# Patient Record
Sex: Female | Born: 1975 | Race: Asian | Hispanic: No | Marital: Married | Smoking: Never smoker
Health system: Southern US, Community
[De-identification: ages and names within clinical notes are randomized; demographics above are authoritative.]

## PROBLEM LIST (undated history)

## (undated) DIAGNOSIS — B1910 Unspecified viral hepatitis B without hepatic coma: Secondary | ICD-10-CM

## (undated) HISTORY — DX: Unspecified viral hepatitis B without hepatic coma: B19.10

## (undated) HISTORY — PX: OTHER SURGICAL HISTORY: SHX169

---

## 2007-01-05 ENCOUNTER — Inpatient Hospital Stay (HOSPITAL_COMMUNITY): Admission: AD | Admit: 2007-01-05 | Discharge: 2007-01-08 | Payer: Self-pay | Admitting: Obstetrics and Gynecology

## 2010-07-10 ENCOUNTER — Inpatient Hospital Stay (HOSPITAL_COMMUNITY)
Admission: AD | Admit: 2010-07-10 | Discharge: 2010-07-12 | DRG: 775 | Disposition: A | Discharge status: Home / Self Care | Payer: Medicaid Other | Source: Ambulatory Visit | Attending: Obstetrics and Gynecology | Admitting: Obstetrics and Gynecology

## 2010-07-10 DIAGNOSIS — O3660X0 Maternal care for excessive fetal growth, unspecified trimester, not applicable or unspecified: Secondary | ICD-10-CM | POA: Diagnosis present

## 2010-07-10 LAB — CBC
Platelets: 190 10*3/uL (ref 150–400)
RBC: 4.19 MIL/uL (ref 3.87–5.11)
WBC: 12.9 10*3/uL — ABNORMAL HIGH (ref 4.0–10.5)

## 2010-07-10 LAB — RPR: RPR Ser Ql: NONREACTIVE

## 2010-07-16 ENCOUNTER — Inpatient Hospital Stay (HOSPITAL_COMMUNITY): Admission: AD | Admit: 2010-07-16 | Payer: Self-pay | Admitting: Obstetrics and Gynecology

## 2010-07-30 NOTE — Discharge Summary (Signed)
  April Gibson, April Gibson                   ACCOUNT NO.:  [id number]  MEDICAL RECORD NO.:  [id number]           PATIENT TYPE:  I  LOCATION:  9133                          FACILITY:  [facility]  PHYSICIAN:  Zenaida Niece, M.D.DATE OF BIRTH:  13-Nov-1975  DATE OF ADMISSION:  07/10/2010 DATE OF DISCHARGE:  07/12/2010                              DISCHARGE SUMMARY   ADMISSION DIAGNOSES:  Intrauterine pregnancy at 39 weeks, hepatitis B carrier.  DISCHARGE DIAGNOSES:  Intrauterine pregnancy at 39 weeks, hepatitis B carrier.  PROCEDURE:  On July 09, 2010, she had a vacuum-assisted vaginal delivery.  HISTORY AND PHYSICAL:  This is a 35 year old gravida 2, para 1-0-0-1 with an EGA of [redacted] weeks who was admitted with regular contractions. Cervix in maternal admissions was 6 cm dilated with regular contractions.  Prenatal care complicated by size greater than dates. Estimated fetal weight 75th percentile and an AFI of 20 on February 21st and prominent fetal renal pelves.  Significant prenatal labs blood type is A+ with negative antibody screen, Hepatitis B surface antigen is positive with normal LFTs.  First trimester screen normal.  AFP is normal Glucola 134, group B strep negative.  PAST OB HISTORY:  One vaginal delivery at 38 weeks, 7 pounds 1 ounce, vacuum extraction.  PAST MEDICAL HISTORY:  Hepatitis B carrier.  PHYSICAL EXAMINATION:  VITAL SIGNS:  She is afebrile with stable vital signs.  Fetal heart tracing category one with regular contractions. ABDOMEN:  Gravid, nontender, with an estimated fetal weight of 7 pounds. Cervix on my first exam, she is 9 complete +1 vertex presentation and membranes were ruptured revealing clear fluid.  HOSPITAL COURSE:  The patient was admitted and progressed on her own. She progressed to complete and pushed fairly well.  She had a vacuum- assisted vaginal delivery due to prolonged variable decelerations with slow recovery.  This delivered a viable female  infant with Apgars of 9 and 9, weight 7 pounds 6 ounces.  Placenta delivered spontaneous was intact, perineum was intact.  Estimated blood loss was less than 500 mL. Postpartum, she had no significant complications.  On postpartum #2, she was stable for discharge home.  DISCHARGE INSTRUCTIONS:  Regular diet, pelvic rest follow-up in 6 weeks. Medications are over-the-counter ibuprofen as needed, and she is given our discharge pamphlet.    Zenaida Niece, M.D.    TDM/MEDQ  D:  07/12/2010  T:  07/12/2010  Job:  [id number]  Electronically Signed by Lavina Hamman M.D. on 07/30/2010 08:53:08 AM

## 2010-09-21 NOTE — Discharge Summary (Signed)
[name], April Gibson                   ACCOUNT NO.:  [id number]   MEDICAL RECORD NO.:  [id number]          PATIENT TYPE:  INP   LOCATION:  9126                          [facility]ACILITY:  [facility]   PHYSICIAN:  Zenaida Niece, M.D.DATE OF BIRTH:  Jan 01, 1976   DATE OF ADMISSION:  01/05/2007  DATE OF DISCHARGE:  01/08/2007                               DISCHARGE SUMMARY   ADMISSION DIAGNOSIS:  1. Intrauterine pregnancy at 38 weeks.  2. Hepatitis B carrier.   DISCHARGE DIAGNOSES:  Same.   PROCEDURES:  On 08/30 she had a vacuum-assisted vaginal delivery.   HISTORY AND PHYSICAL:  This is a 35 year old Asian female, gravida 1,  para 0, with an EGA of 38+ weeks who presented to the office on 08/29  with a complaint of contractions every 7 to 8 minutes and possible  leaking fluid.  Exam in the office revealed cervical change to 4, 70%,  with a bulging bag, Nitrazine positive and FERN negative.  Prenatal care  complicated only by positive hepatitis B carrier with normal LFT's.   PRENATAL LABORATORY STUDIES:  Blood type is A+ with negative antibody  screen, RPR nonreactive, rubella immune, hepatitis B surface antigen is  positive with normal LFT's.  HIV negative, gonorrhea and chlamydia  negative, group B strep is negative, and 1-hour Glucola was 119.   PHYSICAL EXAMINATION:  VITAL SIGNS:  She is afebrile with stable vital  signs.  Fetal heart tracing 140's and reassuring.  ABDOMEN:  Gravid,  nontender.  PELVIC:  Cervix 4/75, bulging bag, and amniotomy reveals clear fluid.   HOSPITAL COURSE:  The patient was admitted, and Dr. Senaida Ores performed  amniotomy.  She then required Pitocin augmentation.  She reached  complete and pushed for approximately three hours.  She brought the  vertex to +2 to +3 and then was exhausted.  Dr. Senaida Ores then  performed a vacuum-assisted vaginal delivery of a viable female infant  with Apgars of 9 and 9, weight 7 pounds 1 ounce.  Placenta delivered  spontaneous.  She  had a small left labial laceration repaired with 2-0  Vicryl.  Cervix, rectum and vagina were otherwise intact and estimated  blood loss 400 mL.  She did have a temperature initially after delivery  to 101.2, and was put on Unasyn.  She remained afebrile after that.  Pre-  delivery hemoglobin was 12.8, post-delivery is 10.8.  On postpartum day  number two, which is September 1, she has been afebrile for at least 36  hours.  She is felt to be stable enough for discharge home.   DISCHARGE INSTRUCTIONS:  Regular diet, pelvic rest, followup in 6 weeks.   DISCHARGE MEDICATIONS:  Percocet #20 one to two p.o. q.4 to 6 hours  p.r.n. pain.   She is given our discharge pamphlet.      Zenaida Niece, M.D.  Electronically Signed     TDM/MEDQ  D:  01/08/2007  T:  01/08/2007  Job:  [id number]

## 2011-02-18 LAB — CBC
Hemoglobin: 10.8 — ABNORMAL LOW
Hemoglobin: 12.8
MCV: 82.3
RBC: 3.97
RBC: 4.8
RDW: 15.7 — ABNORMAL HIGH
WBC: 13.3 — ABNORMAL HIGH
WBC: 24.2 — ABNORMAL HIGH

## 2012-10-25 ENCOUNTER — Ambulatory Visit (INDEPENDENT_AMBULATORY_CARE_PROVIDER_SITE_OTHER): Payer: BC Managed Care – PPO | Admitting: Family Medicine

## 2012-10-25 VITALS — BP 111/78 | HR 94 | Temp 98.0°F | Resp 19 | Ht 59.0 in | Wt 109.0 lb

## 2012-10-25 DIAGNOSIS — R42 Dizziness and giddiness: Secondary | ICD-10-CM

## 2012-10-25 DIAGNOSIS — N390 Urinary tract infection, site not specified: Secondary | ICD-10-CM

## 2012-10-25 DIAGNOSIS — R110 Nausea: Secondary | ICD-10-CM

## 2012-10-25 LAB — POCT CBC
Granulocyte percent: 71.9 % (ref 37–80)
HCT, POC: 43.7 % (ref 37.7–47.9)
Hemoglobin: 13.6 g/dL (ref 12.2–16.2)
Lymph, poc: 2.6 (ref 0.6–3.4)
MCH, POC: 30.2 pg (ref 27–31.2)
MCHC: 31.1 g/dL — AB (ref 31.8–35.4)
MCV: 97 fL (ref 80–97)
MID (cbc): 0.7 (ref 0–0.9)
MPV: 8.9 fL (ref 0–99.8)
POC Granulocyte: 8.6 — AB (ref 2–6.9)
POC LYMPH PERCENT: 22.2 % (ref 10–50)
POC MID %: 5.9 %M (ref 0–12)
Platelet Count, POC: 288 10*3/uL (ref 142–424)
RBC: 4.5 M/uL (ref 4.04–5.48)
RDW, POC: 12.3 %
WBC: 11.9 10*3/uL — AB (ref 4.6–10.2)

## 2012-10-25 LAB — COMPREHENSIVE METABOLIC PANEL
AST: 20 U/L (ref 0–37)
Albumin: 5.1 g/dL (ref 3.5–5.2)
Alkaline Phosphatase: 76 U/L (ref 39–117)
Potassium: 3.8 mEq/L (ref 3.5–5.3)
Sodium: 139 mEq/L (ref 135–145)
Total Protein: 8.7 g/dL — ABNORMAL HIGH (ref 6.0–8.3)

## 2012-10-25 LAB — POCT UA - MICROSCOPIC ONLY
Bacteria, U Microscopic: NEGATIVE
Casts, Ur, LPF, POC: NEGATIVE
Crystals, Ur, HPF, POC: NEGATIVE
Mucus, UA: NEGATIVE
RBC, urine, microscopic: NEGATIVE
Yeast, UA: NEGATIVE

## 2012-10-25 LAB — POCT URINALYSIS DIPSTICK
Bilirubin, UA: NEGATIVE
Blood, UA: NEGATIVE
Glucose, UA: NEGATIVE
Ketones, UA: NEGATIVE
Nitrite, UA: NEGATIVE
Protein, UA: NEGATIVE
Spec Grav, UA: 1.01
Urobilinogen, UA: 0.2
pH, UA: 7

## 2012-10-25 LAB — COMPREHENSIVE METABOLIC PANEL WITH GFR
ALT: 15 U/L (ref 0–35)
BUN: 7 mg/dL (ref 6–23)
CO2: 31 meq/L (ref 19–32)
Calcium: 9.9 mg/dL (ref 8.4–10.5)
Chloride: 101 meq/L (ref 96–112)
Creat: 0.6 mg/dL (ref 0.50–1.10)
Glucose, Bld: 95 mg/dL (ref 70–99)
Total Bilirubin: 0.4 mg/dL (ref 0.3–1.2)

## 2012-10-25 LAB — POCT URINE PREGNANCY: Preg Test, Ur: NEGATIVE

## 2012-10-25 MED ORDER — CIPROFLOXACIN HCL 250 MG PO TABS: 250.0000 mg | ORAL_TABLET | Freq: Two times a day (BID) | ORAL | Status: DC

## 2012-10-25 NOTE — Patient Instructions (Signed)
April Gibson ???ng Ti?t Ni?u  (Urinary Tract Infection)  April Gibson ???ng ti?t ni?u (UTI) c th? pht tri?n ?? b?t c? vi? tri? na?o d?c ???ng ti?t ni?u. ???ng ti?t ni?u l h? th?ng thot n??c c?a c? th? ?? lo?i b? ch?t th?i v n??c d? th?a. ???ng ti?t ni?u bao g?m hai th?n, ni?u qu?n, bng quang v ni?u ??o. Th?n l m?t c?p c? quan hnh h?t ??u. M?i th?n co? kch th??c kho?ng b?ng bn tay c?a b?n. Chng n?m d??i x??ng s??n, m?i bn c?t s?ng c m?t qu?[facility] [facility]  April Gibson gy b?i vi sinh v?t, l cc sinh v?t nh?, bao g?m n?m, vi rt v vi khu?n. Nh?ng sinh v?t ny nh? t?i m?c chng ch? c th? ???c nhn th?y qua knh hi?n vi. Vi khu?n l nh?ng vi sinh v?t ph? bi?n nh?t gy ra UTI.  TRI?U CH?NG  Cc tri?u ch?ng c?a UTI c th? khc nhau, ty theo ?? tu?i v gi?i tnh c?a b?nh nhn v b?i v? tr April Gibson. Cc tri?u ch?ng ? ph? n? tr? th??ng bao g?m nhu c?u ti?u ti?n th??ng xuyn v d? d?i, c?m gic ?au v rt ? bng quang ho?c ni?u ??o khi ?i ti?u. Ph? n? v nam gi?i l?n tu?i c nhi?u kh? n?ng b? m?t m?i, run r?y v y?u, ??ng th?i b? ?au c? v ?au b?ng. S?t c th? c ngh?a l April Gibson ? th?n. Cc tri?u ch?ng khc c?a April Gibson th?n bao g?m ?au ? l?ng ho?c hai bn d??i x??ng s??n, bu?n nn v nn m?a.  CH?N ?ON  ?? ch?n ?on UTI, chuyn gia ch?m [place] y t? s? h?i b?n v? nh?ng tri?u ch?ng c?a b?n. Chuyn gia ch?m [place] y t? c?ng s? yu c?u cung c?p m?u n??c ti?u. M?u n??c ti?u s? ???c xt nghi?m xem c vi khu?n v cc t? bo mu tr?ng khng. Cc t? bo mu tr?ng ???c t?o b?i c? th? ?? gip ch?ng l?i April Gibson.  ?I?U TR?  Thng th??ng, UTI c th? ???c ?i?u tr? b?ng thu?c. B?i v h?u h?t nguyn nhn gy ra UTI l do April Gibson b?i vi khu?n, chng th??ng c th? ???c ?i?u tr? b?ng cch s? d?ng thu?c khng sinh. Vi?c l?a ch?n khng sinh v th?i gian ?i?u tr? ph? thu?c vo tri?u ch?ng v lo?i vi khu?n gy April Gibson.  H??NG D?N CH?M [place] T?I NH   N?u b?n ? ???c k khng sinh, hy s? d?ng chng  ?ng nh? chuyn gia ch?m [place] y t? h??ng d?n. S? d?ng h?t thu?c ngay c? khi b?n c?m th?y kh h?n sau khi ch? dng m?t ph?n thu?c.  U?ng ?? n??c v dung d?ch ?? n??c ti?u trong ho?c c mu vng nh?t.  Trnh caffeine, tr v ?? u?ng c ga. Chng c xu h??ng kch thch bng quang.  ?i ti?u th??ng xuyn. Trnh nh?n ti?u trong th?i gian di.  ?i ti?u tr??c v sau khi quan h? tnh d?c.  Sau khi ?i ??i ti?n, ph? n? c?n lm s?ch t? tr??c ra sau. Ch? s? d?ng gi?y v? sinh m?t l?n. HY THAM V?N V?I CHUYN GIA Y T? N?U:   B?n b? ?au l?ng.  B?n b? s?t.  Cc tri?u ch?ng c?a b?n khng b?t ??u ?? trong vng 3 ngy. HY NGAY L?P T?C THAM V?N V?I CHUYN GIA Y T? N?U:   B?n b? ?au l?ng ho?c ?au b?ng d??i nghim tr?ng.  B?n pht tri?n ?  n l?nh.  B?n b? bu?n nn ho?c nn m?a.  B?n b? rt ho?c kh ch?u ti?p t?c khi ?i ti?u. ??M B?O B?N:   Hi?u cc h??ng d?n ny.  S? theo di tnh tr?ng c?a mnh.  S? yu c?u tr? gip ngay l?p t?c n?u b?n c?m th?y khng kh?e ho?c tnh tr?ng tr? nn t?i h?n. Document Released: 04/25/2005 Document Revised: 10/25/2011 [facility] Patient Information 2014 [place], [place].

## 2012-10-25 NOTE — Progress Notes (Signed)
[facility]:  Office Visit  Chief Complaint:  Chief Complaint  Patient presents with  . Dizziness    dizzy when walking and sitting started tuesday     HPI: April Gibson is a 37 y.o. female who complains of  Dizziness 1-2 seconds then resolves and difficulty walking since Tuesday. No HA or vision changes, no h/o vertigo. No gait changesShe feels fatigued without energy. No dizziness with moving head. Dizziness with sitting to standing   position. + minimal HA on top of head. NO facial pain or ear pain. No allergies. Denies fevers or chills. Denies urinary sxs. Drinks a lot of water. 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 + nausea. LMP October 08, 2012  History reviewed. No pertinent past medical history. History reviewed. No pertinent past surgical history. History   Social History  . Marital Status: Married    Spouse Name: N/A    Number of Children: N/A  . Years of Education: N/A   Social History Main Topics  . Smoking status: Never Smoker   . Smokeless tobacco: None  . Alcohol Use: No  . Drug Use: No  . Sexually Active: None   Other Topics Concern  . None   Social History Narrative  . None   History reviewed. No pertinent family history. No Known Allergies Prior to Admission medications   Not on File     ROS: The patient denies fevers, chills, night sweats, unintentional weight loss, chest pain, palpitations, wheezing, dyspnea on exertion, nausea, vomiting, abdominal pain, dysuria, hematuria, melena, numbness, or tingling.  All other systems have been reviewed and were otherwise negative with the exception of those mentioned in the HPI and as above.    PHYSICAL EXAM: Filed Vitals:   10/25/12 1654  BP: 111/78  Pulse: 94  Temp:   Resp:    Filed Vitals:   10/25/12 1504  Height: 4\' 11"  (1.499 m)  Weight: 109 lb (49.442 kg)   Body mass index is 22 kg/(m^2).  General: Alert, no acute distress HEENT:  Normocephalic,  atraumatic, oropharynx patent. EOMI, PERRLA. Fundoscopic exam nl Cardiovascular:  Regular rate and rhythm, no rubs murmurs or gallops.  No Carotid bruits, radial pulse intact. No pedal edema.  Respiratory: Clear to auscultation bilaterally.  No wheezes, rales, or rhonchi.  No cyanosis, no use of accessory musculature GI: No organomegaly, abdomen is soft and non-tender, positive bowel sounds.  No masses. Skin: No rashes. Neurologic: Facial musculature symmetric. CN 2-12 grossly normal Psychiatric: Patient is appropriate throughout our interaction. Lymphatic: No cervical lymphadenopathy Musculoskeletal: Gait intact.   LABS: Results for orders placed in visit on 10/25/12  POCT CBC      Result Value Range   WBC 11.9 (*) 4.6 - 10.2 K/uL   Lymph, poc 2.6  0.6 - 3.4   POC LYMPH PERCENT 22.2  10 - 50 %L   MID (cbc) 0.7  0 - 0.9   POC MID % 5.9  0 - 12 %M   POC Granulocyte 8.6 (*) 2 - 6.9   Granulocyte percent 71.9  37 - 80 %G   RBC 4.50  4.04 - 5.48 M/uL   Hemoglobin 13.6  12.2 - 16.2 g/dL   HCT, POC 78.4  69.6 - 47.9 %   MCV 97.0  80 - 97 fL   MCH, POC 30.2  27 - 31.2 pg   MCHC 31.1 (*) 31.8 - 35.4 g/dL   RDW, POC 12.3  Platelet Count, POC 288  142 - 424 K/uL   MPV 8.9  0 - 99.8 fL  POCT URINALYSIS DIPSTICK      Result Value Range   Color, UA light yellow     Clarity, UA clear     Glucose, UA neg     Bilirubin, UA neg     Ketones, UA neg     Spec Grav, UA 1.010     Blood, UA neg     pH, UA 7.0     Protein, UA neg     Urobilinogen, UA 0.2     Nitrite, UA neg     Leukocytes, UA Trace    POCT UA - MICROSCOPIC ONLY      Result Value Range   WBC, Ur, HPF, POC 0-3     RBC, urine, microscopic neg     Bacteria, U Microscopic neg     Mucus, UA neg     Epithelial cells, urine per micros 0-1     Crystals, Ur, HPF, POC neg     Casts, Ur, LPF, POC neg     Yeast, UA neg    POCT URINE PREGNANCY      Result Value Range   Preg Test, Ur Negative       EKG/XRAY:   Primary read  interpreted by Dr. Conley Rolls at [facility].   ASSESSMENT/PLAN: Encounter Diagnoses  Name Primary?  . Dizziness, nonspecific Yes  . Nausea alone   . UTI (urinary tract infection)    Urine cx pending Rx Cipro 250 mg BID x 3 days Push fluids Orthostatics are nl. But dizziness is fleeting and usually with postural changes so it may just be that in addition to UTI condiering her mildly leukocytosis. Advise to get up and down slowly Gross sideeffects, risk and benefits, and alternatives of medications d/w patient. Patient is aware that all medications have potential sideeffects and we are unable to predict every sideeffect or drug-drug interaction that may occur. F/u prn    Janoah Menna PHUONG, DO 10/25/2012 5:56 PM

## 2012-10-26 LAB — URINE CULTURE
Colony Count: NO GROWTH
Organism ID, Bacteria: NO GROWTH

## 2012-10-28 ENCOUNTER — Encounter: Payer: Self-pay | Admitting: Family Medicine

## 2012-11-18 ENCOUNTER — Ambulatory Visit (INDEPENDENT_AMBULATORY_CARE_PROVIDER_SITE_OTHER): Payer: BC Managed Care – PPO | Admitting: Family Medicine

## 2012-11-18 ENCOUNTER — Other Ambulatory Visit: Payer: Self-pay | Admitting: Family Medicine

## 2012-11-18 VITALS — BP 108/69 | HR 99 | Temp 98.5°F | Resp 16 | Ht 59.5 in | Wt 112.0 lb

## 2012-11-18 DIAGNOSIS — M62838 Other muscle spasm: Secondary | ICD-10-CM

## 2012-11-18 DIAGNOSIS — K219 Gastro-esophageal reflux disease without esophagitis: Secondary | ICD-10-CM

## 2012-11-18 DIAGNOSIS — R768 Other specified abnormal immunological findings in serum: Secondary | ICD-10-CM

## 2012-11-18 DIAGNOSIS — R894 Abnormal immunological findings in specimens from other organs, systems and tissues: Secondary | ICD-10-CM

## 2012-11-18 DIAGNOSIS — R42 Dizziness and giddiness: Secondary | ICD-10-CM

## 2012-11-18 LAB — POCT CBC
MCH, POC: 30 pg (ref 27–31.2)
MCHC: 30.9 g/dL — AB (ref 31.8–35.4)
MID (cbc): 0.4 (ref 0–0.9)
MPV: 9 fL (ref 0–99.8)
POC LYMPH PERCENT: 36.1 %L (ref 10–50)
POC MID %: 6.1 %M (ref 0–12)
Platelet Count, POC: 306 10*3/uL (ref 142–424)
RBC: 4.56 M/uL (ref 4.04–5.48)
RDW, POC: 13.2 %
WBC: 6.7 10*3/uL (ref 4.6–10.2)

## 2012-11-18 LAB — POCT URINALYSIS DIPSTICK
Bilirubin, UA: NEGATIVE
Ketones, UA: NEGATIVE
Protein, UA: NEGATIVE
Spec Grav, UA: 1.015
pH, UA: 7.5

## 2012-11-18 LAB — COMPREHENSIVE METABOLIC PANEL
ALT: 15 U/L (ref 0–35)
AST: 18 U/L (ref 0–37)
Alkaline Phosphatase: 71 U/L (ref 39–117)
BUN: 12 mg/dL (ref 6–23)
Chloride: 105 mEq/L (ref 96–112)
Creat: 0.5 mg/dL (ref 0.50–1.10)
Total Bilirubin: 0.6 mg/dL (ref 0.3–1.2)

## 2012-11-18 LAB — POCT UA - MICROSCOPIC ONLY: Crystals, Ur, HPF, POC: NEGATIVE

## 2012-11-18 LAB — HEPATITIS B CORE ANTIBODY, TOTAL: Hep B Core Total Ab: POSITIVE — AB

## 2012-11-18 LAB — HEPATITIS B CORE ANTIBODY, IGM: Hep B C IgM: NEGATIVE

## 2012-11-18 MED ORDER — OMEPRAZOLE 40 MG PO CPDR: 40.0000 mg | DELAYED_RELEASE_CAPSULE | Freq: Every day | ORAL | Status: DC

## 2012-11-18 MED ORDER — CYCLOBENZAPRINE HCL 10 MG PO TABS: 10.0000 mg | ORAL_TABLET | Freq: Three times a day (TID) | ORAL | Status: DC | PRN

## 2012-11-18 MED ORDER — MELOXICAM 7.5 MG PO TABS: 7.5000 mg | ORAL_TABLET | Freq: Two times a day (BID) | ORAL | Status: DC | PRN

## 2012-11-18 NOTE — Patient Instructions (Addendum)
Take the stomach medicine omeprazole every day 30 minutes before breakfast for 6 weeks.  Take the anti-inflammatory meloxicam every morning with breakfast for 6 weeks, you can take a second dose during the day if you have headache or neck strain.  If you feel like the muscles in your back or neck are tight, try the flexeril.  If your symptoms continue, make sure you come back for further evaluation.

## 2012-11-18 NOTE — Progress Notes (Signed)
Subjective:    Patient ID: April Gibson, female    DOB: 15-Dec-1975, 37 y.o.   MRN: [id number] Chief Complaint  Patient presents with  . Headache    x 2 days   . Dizziness    x 2 days  . Abdominal Pain    epigastric  . Hand Pain    tightness in left hand     HPI   3 wks ago she was haivng a HA and fell like she wanted to fall down - was seen by Dr. Conley Rolls and diagnosed with UTI and put on cipro but only took for 2d as then she began having back pain.  She was starting to feel better but now for the past 3d she is having problems again w/ HA and lightheadedness, nausea, epigastric pain, fatigue, left arm feels week - not numb - for the past 3d. Occ heartburn or indigestion.  Has epigastric pain which makes it hard to breath.  Is right-handed.  LMP 11/10/12.  HA is in occiput, advil helps a lot, neck tight and painful.  When she works, she feels like she is going to fall forward.   In 2006 was diagnosed with Hep B during her first pregnancy - thinks she still has it but unsure. In 2012, was found to have some sugar in her urine.  History reviewed. No pertinent past medical history. No current outpatient prescriptions on file prior to visit.   No current facility-administered medications on file prior to visit.   No Known Allergies   Review of Systems  Constitutional: Positive for fatigue. Negative for fever, chills, diaphoresis, appetite change and unexpected weight change.  HENT: Positive for neck pain and neck stiffness. Negative for ear pain, congestion, rhinorrhea, dental problem, sinus pressure and tinnitus.   Eyes: Negative for pain, discharge and visual disturbance.  Respiratory: Positive for shortness of breath. Negative for cough.   Cardiovascular: Negative for chest pain, palpitations and leg swelling.  Gastrointestinal: Positive for nausea and abdominal pain. Negative for vomiting.  Musculoskeletal: Positive for myalgias and back pain. Negative for joint swelling, arthralgias and  gait problem.  Skin: Negative for rash.  Neurological: Positive for dizziness, weakness, light-headedness and headaches. Negative for tremors, seizures, syncope, facial asymmetry, speech difficulty and numbness.  Psychiatric/Behavioral: Negative for sleep disturbance.      BP 108/69  Pulse 99  Temp(Src) 98.5 F (36.9 C) (Oral)  Resp 16  Ht 4' 11.5" (1.511 m)  Wt 112 lb (50.803 kg)  BMI 22.25 kg/m2  SpO2 100%  LMP 11/10/2012 Objective:   Physical Exam  Constitutional: She is oriented to person, place, and time. She appears well-developed and well-nourished. No distress.  HENT:  Head: Normocephalic and atraumatic.  Right Ear: External ear normal.  Left Ear: External ear normal.  Eyes: Conjunctivae are normal. No scleral icterus.  Neck: Normal range of motion. Neck supple. No thyromegaly present.  Cardiovascular: Normal rate, regular rhythm, normal heart sounds and intact distal pulses.   Pulmonary/Chest: Effort normal and breath sounds normal. No respiratory distress.  Musculoskeletal: She exhibits no edema.       Right shoulder: Normal. She exhibits normal range of motion.       Left shoulder: Normal. She exhibits normal range of motion.       Cervical back: Normal. She exhibits normal range of motion.  Lymphadenopathy:    She has no cervical adenopathy.  Neurological: She is alert and oriented to person, place, and time.  Reflex Scores:  Bicep reflexes are 2+ on the right side and 2+ on the left side.      Brachioradialis reflexes are 2+ on the right side and 2+ on the left side.      Patellar reflexes are 2+ on the right side and 2+ on the left side. Skin: Skin is warm and dry. She is not diaphoretic. No erythema.  Psychiatric: She has a normal mood and affect. Her behavior is normal.      Results for orders placed in visit on 11/18/12  POCT UA - MICROSCOPIC ONLY      Result Value Range   WBC, Ur, HPF, POC 4-6     RBC, urine, microscopic 1-2     Bacteria, U  Microscopic 1+     Mucus, UA positive     Epithelial cells, urine per micros 2-5     Crystals, Ur, HPF, POC negative     Casts, Ur, LPF, POC negative     Yeast, UA negative    POCT URINALYSIS DIPSTICK      Result Value Range   Color, UA pale yellow     Clarity, UA clear     Glucose, UA neg     Bilirubin, UA neg     Ketones, UA neg     Spec Grav, UA 1.015     Blood, UA neg     pH, UA 7.5     Protein, UA neg     Urobilinogen, UA 0.2     Nitrite, UA neg     Leukocytes, UA Trace    POCT CBC      Result Value Range   WBC 6.7  4.6 - 10.2 K/uL   Lymph, poc 2.4  0.6 - 3.4   POC LYMPH PERCENT 36.1  10 - 50 %L   MID (cbc) 0.4  0 - 0.9   POC MID % 6.1  0 - 12 %M   POC Granulocyte 3.9  2 - 6.9   Granulocyte percent 57.8  37 - 80 %G   RBC 4.56  4.04 - 5.48 M/uL   Hemoglobin 13.7  12.2 - 16.2 g/dL   HCT, POC 45.4  09.8 - 47.9 %   MCV 97.1 (*) 80 - 97 fL   MCH, POC 30.0  27 - 31.2 pg   MCHC 30.9 (*) 31.8 - 35.4 g/dL   RDW, POC 11.9     Platelet Count, POC 306  142 - 424 K/uL   MPV 9.0  0 - 99.8 fL   EKG: NSR, no ischemic changes Assessment & Plan:  Dizziness and giddiness - Plan: POCT glucose (manual entry), POCT SEDIMENTATION RATE, POCT UA - Microscopic Only, POCT urinalysis dipstick, POCT CBC, Comprehensive metabolic panel, TSH, EKG 12-Lead - suspect due to trapezius muscle spasm causing headache, neck tightness, and left arm numbness. Try meds but if sxs cont, RTC.  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 If cont, RTC and cons xray and PT.  GERD (gastroesophageal reflux disease) - trial of ppi  Meds ordered this encounter  Medications  . omeprazole (PRILOSEC) 40 MG capsule    Sig: Take 1 capsule (40 mg total) by mouth daily. 30 minutes before a meal.     Dispense:  30 capsule    Refill:  1  . cyclobenzaprine (FLEXERIL) 10 MG tablet    Sig: Take 1 tablet (10 mg total) by mouth 3 (three) times daily as needed for muscle spasms.    Dispense:  30 tablet    Refill:  0  . meloxicam (MOBIC) 7.5 MG tablet    Sig: Take 1 tablet (7.5 mg total) by mouth 2 (two) times daily as needed for pain (or headache).    Dispense:  60 tablet    Refill:  1

## 2012-11-19 ENCOUNTER — Other Ambulatory Visit: Payer: Self-pay | Admitting: Family Medicine

## 2012-11-19 ENCOUNTER — Telehealth: Payer: Self-pay | Admitting: Family Medicine

## 2012-11-19 LAB — HEPATITIS B SURF AG CONFIRMATION: Hepatitis B Surf Ag Confirmation: POSITIVE — AB

## 2012-11-19 LAB — HEPATITIS B SURFACE ANTIGEN

## 2012-11-19 NOTE — Telephone Encounter (Signed)
April Gibson from [facility] called back, she states that the [facility] is a out house lab. The requirement is Critical Frozen from a red top. This lab is not done at [facility].

## 2012-11-19 NOTE — Telephone Encounter (Signed)
Ok, fine to cancel

## 2012-11-20 LAB — HEPATITIS B SURFACE ANTIBODY,QUALITATIVE: Hep B S Ab: NEGATIVE

## 2012-11-21 LAB — HEPATITIS B DNA, ULTRAQUANTITATIVE, PCR: Hepatitis B DNA: NOT DETECTED IU/mL (ref <20)

## 2012-11-22 DIAGNOSIS — B1910 Unspecified viral hepatitis B without hepatic coma: Secondary | ICD-10-CM

## 2012-11-22 HISTORY — DX: Unspecified viral hepatitis B without hepatic coma: B19.10

## 2013-01-11 ENCOUNTER — Other Ambulatory Visit: Payer: Self-pay | Admitting: Family Medicine

## 2013-01-24 ENCOUNTER — Encounter: Payer: Self-pay | Admitting: Family Medicine

## 2013-05-10 ENCOUNTER — Encounter: Payer: Self-pay | Admitting: Family Medicine

## 2014-06-18 ENCOUNTER — Ambulatory Visit (INDEPENDENT_AMBULATORY_CARE_PROVIDER_SITE_OTHER): Payer: 59 | Admitting: Family Medicine

## 2014-06-18 VITALS — BP 104/72 | HR 87 | Temp 99.0°F | Resp 20 | Ht 59.0 in | Wt 117.0 lb

## 2014-06-18 DIAGNOSIS — R768 Other specified abnormal immunological findings in serum: Secondary | ICD-10-CM

## 2014-06-18 DIAGNOSIS — M542 Cervicalgia: Secondary | ICD-10-CM

## 2014-06-18 DIAGNOSIS — R7689 Other specified abnormal immunological findings in serum: Secondary | ICD-10-CM

## 2014-06-18 DIAGNOSIS — M6248 Contracture of muscle, other site: Secondary | ICD-10-CM

## 2014-06-18 DIAGNOSIS — M62838 Other muscle spasm: Secondary | ICD-10-CM

## 2014-06-18 DIAGNOSIS — R894 Abnormal immunological findings in specimens from other organs, systems and tissues: Secondary | ICD-10-CM

## 2014-06-18 DIAGNOSIS — R42 Dizziness and giddiness: Secondary | ICD-10-CM

## 2014-06-18 MED ORDER — CYCLOBENZAPRINE HCL 10 MG PO TABS: 10.0000 mg | ORAL_TABLET | Freq: Three times a day (TID) | ORAL | Status: DC | PRN

## 2014-06-18 MED ORDER — MELOXICAM 7.5 MG PO TABS: 7.5000 mg | ORAL_TABLET | Freq: Every day | ORAL | Status: DC

## 2014-06-18 NOTE — Progress Notes (Signed)
Subjective:    Patient ID: April Gibson, female    DOB: 1976/03/21, 39 y.o.   MRN: [id number]  Chief Complaint  Patient presents with  . Headache   Patient Active Problem List   Diagnosis Date Noted  . Hepatitis B infection without delta agent without hepatic coma 11/22/2012   Prior to Admission medications   Medication Sig Start Date End Date Taking? Authorizing Provider  Multiple Vitamin (MULTIVITAMIN) tablet Take 1 tablet by mouth daily.   Yes Historical Provider, MD   Medications, allergies, past medical history, surgical history, family history, social history and problem list reviewed and updated.  HPI  89 yof with no pertinent pmh presents with one wk h/o dizziness and 2 day h/o occipital ha.   Dizziness has been intermittent for past week. Has episodes approx 2-3 times per day. Episodes typically occur when she is seated. Not with certain head movements/positions. Not with position changes. Denies weakness, denies vertigo. Denies presyncope. Denies vision changes, blurry vision, double vision. Denies any extremity weakness, numbness.   She has also had an occipital ha for the past few days. This has been steady and rated as mild-moderate.   All of these sx are very similar for her to last time she presented here which was about 1.5 yrs ago. At that time her sx were similar. 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 Started flxeril and mobic which resolved both were occipital has and dizziness. Her sx did not return again until last week. She continues to work as Scientist, forensic.   Denies cp, sob. She drinks good amnt of water daily. No alcohol, no caffeine intake. No hx diabetes/prediabetes.   At that time her thyroid, cbc, cmp, esr were all normal. EKG was normal. UA with micro was normal.   Review of Systems See HPI. No dysuria.     Objective:   Physical Exam  Constitutional: She is oriented to person, place, and time. She appears  well-developed and well-nourished.  Non-toxic appearance. She does not have a sickly appearance. She does not appear ill. No distress.  BP 104/72 mmHg  Pulse 87  Temp(Src) 99 F (37.2 C) (Oral)  Resp 20  Ht _0  (1.499 m)  Wt 117 lb (53.071 kg)  BMI 23.62 kg/m2  SpO2 99%  LMP 06/13/2014   HENT:  Right Ear: Tympanic membrane normal.  Left Ear: Tympanic membrane normal.  Eyes: Conjunctivae and EOM are normal. Pupils are equal, round, and reactive to light.  Neck: Muscular tenderness present. No spinous process tenderness present. Normal range of motion present. No Brudzinski's sign noted.  Spasms noted along cervical spine bilaterally. Full rom.   Cardiovascular: Normal rate, regular rhythm, normal heart sounds and normal pulses.  Exam reveals no gallop.   No murmur heard. Pulmonary/Chest: Effort normal and breath sounds normal. No tachypnea. She has no decreased breath sounds. She has no wheezes. She has no rhonchi. She has no rales.  Lymphadenopathy:       Head (right side): No submental, no submandibular and no tonsillar adenopathy present.       Head (left side): No submental, no submandibular and no tonsillar adenopathy present.    She has no cervical adenopathy.  Neurological: She is alert and oriented to person, place, and time. She has normal strength. No cranial nerve deficit or sensory deficit. She displays a negative Romberg sign. Coordination and gait normal. She displays no Babinski's sign on the right side.  She displays no Babinski's sign on the left side.  Reflex Scores:      Patellar reflexes are 2+ on the right side and 2+ on the left side.      Achilles reflexes are 2+ on the right side and 2+ on the left side. Normal rapid alternating movements. Normal heel to shin. Negative bppv testing bilaterally.   Psychiatric: She has a normal mood and affect. Her speech is normal and behavior is normal.   EKG read by Dr. Marin Comment.  Findings: Normal.      Assessment & Plan:    68 yof with no pertinent pmh presents with one wk h/o dizziness and 2 day h/o occipital ha.   Dizziness and giddiness - Plan: Comprehensive metabolic panel, CBC, TSH, EKG 12-Lead --normal neuro exam, normal vitals, normal ekg, no assoc palps --no vertigo, neg bppv testing --sx similar to 1.5 yrs ago when they resolved with flexeril and mobic, suspect same etiology today --cmp, cbc, tsh today --doubt dehydration with no turgor, moist mucus membranes --rtc if not resolving in 4-5 days for further workup  Neck pain, bilateral posterior Muscle spasms of neck - Plan: cyclobenzaprine (FLEXERIL) 10 MG tablet, meloxicam (MOBIC) 7.5 MG tablet --occipital headahces most likely due to cervical muscle spasms from working with neck flexed --not worst ha of life, no fever/chills, not sudden onset --normal cerebellar testing --flexeril/mobic --rtc if not improving --work on proper posture with work  Julieta Gutting, PA-C Physician Assistant-Certified [facility]  06/18/2014 7:46 PM

## 2014-06-18 NOTE — Patient Instructions (Signed)
I think your headaches and dizziness are most likely due to the muscle spasms along your neck. Please take the flexeril every 8 hours as needed for the spasm. Please take the mobic once daily for the pain in your neck. We drew some labs today and I'll be in contact with you with those results. Your ekg looked great today. If you continue to have the dizzy spells, if you have any passing out episodes, if you feel your heart racing/skipping beats on you, please come back to be seen or go to ED.

## 2014-06-19 LAB — COMPREHENSIVE METABOLIC PANEL
ALBUMIN: 4.4 g/dL (ref 3.5–5.2)
ALK PHOS: 60 U/L (ref 39–117)
ALT: 18 U/L (ref 0–35)
AST: 21 U/L (ref 0–37)
BUN: 14 mg/dL (ref 6–23)
CHLORIDE: 103 meq/L (ref 96–112)
CO2: 27 mEq/L (ref 19–32)
Calcium: 9.4 mg/dL (ref 8.4–10.5)
Creat: 0.61 mg/dL (ref 0.50–1.10)
Glucose, Bld: 95 mg/dL (ref 70–99)
POTASSIUM: 4.2 meq/L (ref 3.5–5.3)
SODIUM: 140 meq/L (ref 135–145)
TOTAL PROTEIN: 8.1 g/dL (ref 6.0–8.3)
Total Bilirubin: 0.3 mg/dL (ref 0.2–1.2)

## 2014-06-19 LAB — CBC
HCT: 39.7 % (ref 36.0–46.0)
Hemoglobin: 13.2 g/dL (ref 12.0–15.0)
MCH: 30 pg (ref 26.0–34.0)
MCHC: 33.2 g/dL (ref 30.0–36.0)
MCV: 90.2 fL (ref 78.0–100.0)
MPV: 9.9 fL (ref 8.6–12.4)
PLATELETS: 323 10*3/uL (ref 150–400)
RBC: 4.4 MIL/uL (ref 3.87–5.11)
RDW: 12.8 % (ref 11.5–15.5)
WBC: 9.7 10*3/uL (ref 4.0–10.5)

## 2014-06-19 LAB — TSH: TSH: 1.649 u[IU]/mL (ref 0.350–4.500)

## 2014-06-23 NOTE — Progress Notes (Signed)
Reviewed, agree with A/P. Dr Riely Oetken 

## 2014-07-14 ENCOUNTER — Encounter (HOSPITAL_COMMUNITY): Payer: Self-pay | Admitting: Emergency Medicine

## 2014-07-14 ENCOUNTER — Emergency Department (HOSPITAL_COMMUNITY)
Admission: EM | Admit: 2014-07-14 | Discharge: 2014-07-14 | Disposition: A | Discharge status: Home / Self Care | Payer: 59 | Attending: Emergency Medicine | Admitting: Emergency Medicine

## 2014-07-14 DIAGNOSIS — R000 Tachycardia, unspecified: Secondary | ICD-10-CM | POA: Diagnosis not present

## 2014-07-14 DIAGNOSIS — Z3202 Encounter for pregnancy test, result negative: Secondary | ICD-10-CM | POA: Diagnosis not present

## 2014-07-14 DIAGNOSIS — Z8619 Personal history of other infectious and parasitic diseases: Secondary | ICD-10-CM | POA: Insufficient documentation

## 2014-07-14 DIAGNOSIS — R002 Palpitations: Secondary | ICD-10-CM | POA: Diagnosis not present

## 2014-07-14 DIAGNOSIS — Z791 Long term (current) use of non-steroidal anti-inflammatories (NSAID): Secondary | ICD-10-CM | POA: Insufficient documentation

## 2014-07-14 DIAGNOSIS — R42 Dizziness and giddiness: Secondary | ICD-10-CM | POA: Diagnosis not present

## 2014-07-14 LAB — CBC
HEMATOCRIT: 37.3 % (ref 36.0–46.0)
Hemoglobin: 12.5 g/dL (ref 12.0–15.0)
MCH: 29.9 pg (ref 26.0–34.0)
MCHC: 33.5 g/dL (ref 30.0–36.0)
MCV: 89.2 fL (ref 78.0–100.0)
PLATELETS: 266 10*3/uL (ref 150–400)
RBC: 4.18 MIL/uL (ref 3.87–5.11)
RDW: 12.6 % (ref 11.5–15.5)
WBC: 8.8 10*3/uL (ref 4.0–10.5)

## 2014-07-14 LAB — BASIC METABOLIC PANEL
Anion gap: 10 (ref 5–15)
BUN: 6 mg/dL (ref 6–23)
CHLORIDE: 104 mmol/L (ref 96–112)
CO2: 22 mmol/L (ref 19–32)
Calcium: 8.9 mg/dL (ref 8.4–10.5)
Creatinine, Ser: 0.49 mg/dL — ABNORMAL LOW (ref 0.50–1.10)
GFR calc Af Amer: >90 mL/min (ref >90)
GFR calc non Af Amer: >90 mL/min (ref >90)
GLUCOSE: 94 mg/dL (ref 70–99)
POTASSIUM: 3.4 mmol/L — AB (ref 3.5–5.1)
Sodium: 136 mmol/L (ref 135–145)

## 2014-07-14 LAB — URINALYSIS, ROUTINE W REFLEX MICROSCOPIC
Bilirubin Urine: NEGATIVE
GLUCOSE, UA: NEGATIVE mg/dL
KETONES UR: 15 mg/dL — AB
Nitrite: NEGATIVE
PROTEIN: NEGATIVE mg/dL
Specific Gravity, Urine: 1.005 (ref 1.005–1.030)
Urobilinogen, UA: 0.2 mg/dL (ref 0.0–1.0)
pH: 6 (ref 5.0–8.0)

## 2014-07-14 LAB — URINE MICROSCOPIC-ADD ON

## 2014-07-14 LAB — POC URINE PREG, ED: Preg Test, Ur: NEGATIVE

## 2014-07-14 MED ORDER — MELOXICAM 7.5 MG PO TABS: 7.5000 mg | ORAL_TABLET | Freq: Every day | ORAL | Status: DC

## 2014-07-14 MED ORDER — CYCLOBENZAPRINE HCL 10 MG PO TABS: 10.0000 mg | ORAL_TABLET | Freq: Three times a day (TID) | ORAL | Status: DC | PRN

## 2014-07-14 MED ORDER — SODIUM CHLORIDE 0.9 % IV BOLUS (SEPSIS)
1000.0000 mL | Freq: Once | INTRAVENOUS | Status: AC
Start: 2014-07-14 — End: 2014-07-14
  Administered 2014-07-14: 1000 mL via INTRAVENOUS

## 2014-07-14 NOTE — ED Notes (Signed)
Pt reports dizziness when she puts her head down. sts she feels like she is going to pass out. Also reports palpitations when this happens. This started 1 hour ago. Denies weakness/numbness, nvd.

## 2014-07-14 NOTE — ED Notes (Signed)
Pt also reports heaviness in her neck. No fever noted.

## 2014-07-14 NOTE — Discharge Instructions (Signed)

## 2014-07-14 NOTE — ED Provider Notes (Signed)
CSN: [id number]     Arrival date & time 07/14/14  1528 History   First MD Initiated Contact with Patient 07/14/14 1658     Chief Complaint  Patient presents with  . Dizziness     (Consider location/radiation/quality/duration/timing/severity/associated sxs/prior Treatment) Patient is a 39 y.o. female presenting with dizziness. The history is provided by the patient. No language interpreter was used.  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 She feels her heart races during the episodes but denies chest pain, SOB, N, V, D or febrile illness. She has had similar symptoms in the past that have been evaluated multiple times.  She also complains of soreness in her posterior neck that is recurrent as well. No headache.   Past Medical History  Diagnosis Date  . 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 monitor labs yearly   History reviewed. No pertinent past surgical history. No family history on file. History  Substance Use Topics  . Smoking status: Never Smoker   . Smokeless tobacco: Not on file  . Alcohol Use: No   OB History    No data available     Review of Systems  Constitutional: Negative for fever and chills.  HENT: Negative.   Respiratory: Negative.   Cardiovascular: Positive for palpitations.  Gastrointestinal: Negative.   Genitourinary: Negative.   Musculoskeletal: Negative.   Skin: Negative.   Neurological: Positive for dizziness and light-headedness. Negative for headaches.      Allergies  Review of patient's allergies indicates no known allergies.  Home Medications   Prior to Admission medications   Medication Sig Start Date End Date Taking?  Authorizing Provider  cyclobenzaprine (FLEXERIL) 10 MG tablet Take 1 tablet (10 mg total) by mouth 3 (three) times daily as needed for muscle spasms. 06/18/14   Huey Bienenstock McVeigh, PA  meloxicam (MOBIC) 7.5 MG tablet Take 1 tablet (7.5 mg total) by mouth daily. 06/18/14   Donnajean Lopes, PA  Multiple Vitamin (MULTIVITAMIN) tablet Take 1 tablet by mouth daily.    Historical Provider, MD   BP 113/76 mmHg  Pulse 95  Temp(Src) 97.7 F (36.5 C) (Oral)  Resp 20  Ht  (1.499 m)  Wt 118 lb (53.524 kg)  BMI 23.82 kg/m2  SpO2 100%  LMP 06/13/2014 Physical Exam  Constitutional: She is oriented to person, place, and time. She appears well-developed and well-nourished.  HENT:  Head: Normocephalic.  Mouth/Throat: Oropharynx is clear and moist.  Eyes: Pupils are equal, round, and reactive to light.  Neck: Normal range of motion. Neck supple. No thyromegaly present.  Cardiovascular: Regular rhythm.  Tachycardia present.   No carotid bruit.  Pulmonary/Chest: Effort normal and breath sounds normal.  Abdominal: Soft. Bowel sounds are normal. There is no tenderness. There is no rebound and no guarding.  Musculoskeletal: Normal range of motion.  Paracervical tenderness without swelling. No midline tenderness.   Neurological: She is alert and oriented to person, place, and time. She has normal strength and normal reflexes. No sensory deficit. She displays a negative Romberg sign. Coordination normal.  Speech clear and focused. No nystagmus. CN's 3-12 grossly intact. No deficits of coordination.  Skin: Skin is warm and dry. No rash noted.  Psychiatric: She has a normal mood  and affect.    ED Course  Procedures (including critical care time) Labs Review Labs Reviewed  BASIC METABOLIC PANEL - Abnormal; Notable for the following:    Potassium 3.4 (*)    Creatinine, Ser 0.49 (*)    All other components within normal limits  CBC  URINALYSIS, ROUTINE W REFLEX MICROSCOPIC  POC URINE PREG, ED   Results  for orders placed or performed during the hospital encounter of 07/14/14  CBC  Result Value Ref Range   WBC 8.8 4.0 - 10.5 K/uL   RBC 4.18 3.87 - 5.11 MIL/uL   Hemoglobin 12.5 12.0 - 15.0 g/dL   HCT 04.537.3 40.936.0 - 81.146.0 %   MCV 89.2 78.0 - 100.0 fL   MCH 29.9 26.0 - 34.0 pg   MCHC 33.5 30.0 - 36.0 g/dL   RDW 91.412.6 78.211.5 - 95.615.5 %   Platelets 266 150 - 400 K/uL  Basic metabolic panel  Result Value Ref Range   Sodium 136 135 - 145 mmol/L   Potassium 3.4 (L) 3.5 - 5.1 mmol/L   Chloride 104 96 - 112 mmol/L   CO2 22 19 - 32 mmol/L   Glucose, Bld 94 70 - 99 mg/dL   BUN 6 6 - 23 mg/dL   Creatinine, Ser 2.130.49 (L) 0.50 - 1.10 mg/dL   Calcium 8.9 8.4 - 08.610.5 mg/dL   GFR calc non Af Amer >90 >90 mL/min   GFR calc Af Amer >90 >90 mL/min   Anion gap 10 5 - 15  POC Urine Pregnancy, ED (pre-menopausal females) - [facility]  Result Value Ref Range   Preg Test, Ur NEGATIVE NEGATIVE    Imaging Review No results found.   EKG Interpretation   Date/Time:  Monday July 14 2014 15:32:33 EST Ventricular Rate:  111 PR Interval:  160 QRS Duration: 68 QT Interval:  306 QTC Calculation: 416 R Axis:   86 Text Interpretation:  Sinus tachycardia Otherwise normal ECG No old  tracing to compare Confirmed by GOLDSTON  MD, SCOTT ([id number]) on 07/14/2014  4:56:43 PM      MDM   Final diagnoses:  None    1. Lightheadedness  The patient's chart is reviewed. She has had multiple evaluations for same symptoms over the last year. Thyroid normal when tested - not repeated here. She reports her neck soreness improves with Mobic and Flexeril - will provide this medication. Heart rate is improved with fluids. No lightheadedness at this time. She is felt stable for discharge and encouraged to follow up with PCP.    Elpidio AnisShari Kekoa Fyock, PA-C 07/14/14 1856  Pricilla LovelessScott Goldston, MD 07/15/14 [phone]

## 2014-07-15 ENCOUNTER — Other Ambulatory Visit: Payer: Self-pay | Admitting: Physician Assistant

## 2014-07-18 ENCOUNTER — Ambulatory Visit (INDEPENDENT_AMBULATORY_CARE_PROVIDER_SITE_OTHER): Payer: 59 | Admitting: Family Medicine

## 2014-07-18 VITALS — BP 102/78 | HR 106 | Temp 98.5°F | Resp 16 | Ht 59.0 in | Wt 116.4 lb

## 2014-07-18 DIAGNOSIS — R208 Other disturbances of skin sensation: Secondary | ICD-10-CM | POA: Diagnosis not present

## 2014-07-18 DIAGNOSIS — R42 Dizziness and giddiness: Secondary | ICD-10-CM

## 2014-07-18 DIAGNOSIS — N3000 Acute cystitis without hematuria: Secondary | ICD-10-CM | POA: Diagnosis not present

## 2014-07-18 DIAGNOSIS — R200 Anesthesia of skin: Secondary | ICD-10-CM

## 2014-07-18 MED ORDER — CIPROFLOXACIN HCL 250 MG PO TABS: 250.0000 mg | ORAL_TABLET | Freq: Two times a day (BID) | ORAL | Status: DC

## 2014-07-18 MED ORDER — MECLIZINE HCL 25 MG PO TABS: 25.0000 mg | ORAL_TABLET | Freq: Two times a day (BID) | ORAL | Status: DC | PRN

## 2014-07-18 NOTE — Patient Instructions (Signed)

## 2014-07-18 NOTE — Progress Notes (Signed)
Subjective:    Patient ID: April Gibson, female    DOB: 12/09/1975, 39 y.o.   MRN: [id number]  This chart was scribed for Elvina SidleKurt Lauenstein, MD, by Ronney LionSuzanne Le, ED Scribe. This patient was seen in room 12 and the patient's care was started at 1:37 PM.  Dizziness Associated symptoms include numbness and weakness. Pertinent negatives include no arthralgias or vomiting.    Chief Complaint  Patient presents with   Dizziness    x 5 days   Numbness    Left hand, Left leg  x 2 days    HPI Comments: April Gibson is a 39 y.o. female who presents to the [facility] complaining of constant dizziness for 4 days. She states she was at work she felt extremely lightheaded and tachycardic, [facility] tests and UA.  Sitting and standing up, ambulating, and especially looking down all exacerbate her dizziness. Turning her head from side to side somewhat exacerbates the dizziness.  This is a new problem. She denies vomiting, otalgia, hearing loss, tinnitus, or dysuria.   Per chart review, patient was seen 10/25/2012 with a diagnosis of dizziness associated with UTI.   She also complains of associated numbness and weakness in her left upper and lower extremities, especially at night.  Patient works in a [employer].  Review of Systems  HENT: Negative for ear pain, hearing loss and tinnitus.   Gastrointestinal: Negative for vomiting.  Genitourinary: Negative for dysuria.  Musculoskeletal: Negative for arthralgias.  Neurological: Positive for dizziness, weakness and numbness.       Objective:   Physical Exam  Constitutional: She is oriented to person, place, and time. She appears well-developed and well-nourished. No distress.  HENT:  Head: Normocephalic and atraumatic.  Eyes: Conjunctivae and EOM are normal.  Neck: Neck supple. No tracheal deviation present.  Cardiovascular: Normal rate.   Pulmonary/Chest: Effort normal. No respiratory distress.    Musculoskeletal: Normal range of motion.  Neurological: She is alert and oriented to person, place, and time.  Skin: Skin is warm and dry.  Psychiatric: She has a normal mood and affect. Her behavior is normal.  Nursing note and vitals reviewed.   Results for orders placed or performed during the hospital encounter of 07/14/14  CBC  Result Value Ref Range   WBC 8.8 4.0 - 10.5 K/uL   RBC 4.18 3.87 - 5.11 MIL/uL   Hemoglobin 12.5 12.0 - 15.0 g/dL   HCT 40.937.3 81.136.0 - 91.446.0 %   MCV 89.2 78.0 - 100.0 fL   MCH 29.9 26.0 - 34.0 pg   MCHC 33.5 30.0 - 36.0 g/dL   RDW 78.212.6 95.611.5 - 21.315.5 %   Platelets 266 150 - 400 K/uL  Basic metabolic panel  Result Value Ref Range   Sodium 136 135 - 145 mmol/L   Potassium 3.4 (L) 3.5 - 5.1 mmol/L   Chloride 104 96 - 112 mmol/L   CO2 22 19 - 32 mmol/L   Glucose, Bld 94 70 - 99 mg/dL   BUN 6 6 - 23 mg/dL   Creatinine, Ser 0.860.49 (L) 0.50 - 1.10 mg/dL   Calcium 8.9 8.4 - 57.810.5 mg/dL   GFR calc non Af Amer >90 >90 mL/min   GFR calc Af Amer >90 >90 mL/min   Anion gap 10 5 - 15  Urinalysis, Routine w reflex microscopic  Result Value Ref Range   Color, Urine YELLOW YELLOW   APPearance CLEAR CLEAR  Specific Gravity, Urine 1.005 1.005 - 1.030   pH 6.0 5.0 - 8.0   Glucose, UA NEGATIVE NEGATIVE mg/dL   Hgb urine dipstick TRACE (A) NEGATIVE   Bilirubin Urine NEGATIVE NEGATIVE   Ketones, ur 15 (A) NEGATIVE mg/dL   Protein, ur NEGATIVE NEGATIVE mg/dL   Urobilinogen, UA 0.2 0.0 - 1.0 mg/dL   Nitrite NEGATIVE NEGATIVE   Leukocytes, UA MODERATE (A) NEGATIVE  Urine microscopic-add on  Result Value Ref Range   Squamous Epithelial / LPF RARE RARE   WBC, UA 7-10 <3 WBC/hpf   RBC / HPF 0-2 <3 RBC/hpf   Bacteria, UA FEW (A) RARE  POC Urine Pregnancy, ED (pre-menopausal females) - do not order at [facility]  Result Value Ref Range   Preg Test, Ur NEGATIVE NEGATIVE        Assessment & Plan:  Patient symptoms are similar to what she had 2 years ago when she also had a  UTI with pyuria. She has no evidence for dehydration either today or 4 days ago.  This chart was scribed in my presence and reviewed by me personally.    ICD-9-CM ICD-10-CM   1. Acute cystitis without hematuria 595.0 N30.00 ciprofloxacin (CIPRO) 250 MG tablet  2. Dizziness and giddiness 780.4 R42 meclizine (ANTIVERT) 25 MG tablet  3. Left arm numbness 782.0 R20.8      Signed, Elvina Sidle, MD

## 2014-08-06 ENCOUNTER — Ambulatory Visit (INDEPENDENT_AMBULATORY_CARE_PROVIDER_SITE_OTHER): Payer: 59 | Admitting: Family Medicine

## 2014-08-06 VITALS — BP 110/72 | HR 89 | Temp 98.4°F | Resp 18 | Ht 59.0 in | Wt 118.4 lb

## 2014-08-06 DIAGNOSIS — E876 Hypokalemia: Secondary | ICD-10-CM | POA: Diagnosis not present

## 2014-08-06 DIAGNOSIS — N3000 Acute cystitis without hematuria: Secondary | ICD-10-CM

## 2014-08-06 DIAGNOSIS — E162 Hypoglycemia, unspecified: Secondary | ICD-10-CM

## 2014-08-06 DIAGNOSIS — N390 Urinary tract infection, site not specified: Secondary | ICD-10-CM

## 2014-08-06 DIAGNOSIS — R42 Dizziness and giddiness: Secondary | ICD-10-CM

## 2014-08-06 LAB — POCT UA - MICROSCOPIC ONLY
Bacteria, U Microscopic: NEGATIVE
Casts, Ur, LPF, POC: NEGATIVE
Crystals, Ur, HPF, POC: NEGATIVE
Mucus, UA: NEGATIVE
RBC, urine, microscopic: NEGATIVE
WBC, Ur, HPF, POC: NEGATIVE
Yeast, UA: NEGATIVE

## 2014-08-06 LAB — POCT URINALYSIS DIPSTICK
Bilirubin, UA: NEGATIVE
Blood, UA: NEGATIVE
Glucose, UA: NEGATIVE
Ketones, UA: NEGATIVE
Nitrite, UA: NEGATIVE
Protein, UA: NEGATIVE
Spec Grav, UA: 1.015
Urobilinogen, UA: 0.2
pH, UA: 7

## 2014-08-06 LAB — POCT GLYCOSYLATED HEMOGLOBIN (HGB A1C): Hemoglobin A1C: 4.6

## 2014-08-06 LAB — GLUCOSE, POCT (MANUAL RESULT ENTRY): POC Glucose: 69 mg/dl — AB (ref 70–99)

## 2014-08-06 MED ORDER — CIPROFLOXACIN HCL 500 MG PO TABS: 500.0000 mg | ORAL_TABLET | Freq: Two times a day (BID) | ORAL | Status: DC

## 2014-08-06 NOTE — Patient Instructions (Addendum)
H? ???ng huy?t (Hypoglycemia) H? ???ng huy?t x?y ra khi l??ng glucose trong mu qu th?p. Glucose l m?t lo?i ???ng l ngu?n n?ng l??ng chnh cho c? th? qu v?. Nh?ng hoc-mon, ch?ng h?n nh? insulin v glucagon, ki?m sot l??ng glucose trong mu. Insulin lm gi?m l??ng glucose trong mu v glucagon lm t?ng l??ng glucose trong mu. C qu nhi?u insulin trong mu, ho?c khng ?n ?? th?c ?n ch?a ???ng, c th? gy ra h? ???ng huy?t. H? ???ng huy?t c th? x?y ra ? nh?ng ng??i b? ho?c khng b? ti?u ???ng. N c th? pht tri?n nhanh chng v c th? l m?t tnh hu?ng c?p c?u y [place].  NGUYN NHN   B? b?a ho?c dng b?a mu?n.  Khng ?n ?? carbohydrates trong b?a ?n.  Dng qu nhi?u thu?c tr? ti?u ???ng.  Khng ch?n th?i gian u?ng thu?c tr? ti?u ???ng ho?c cc li?u insulin cng v?i b?a ?n, b?a ?n nh?, v t?p th? d?c.  Bu?n nn v nn m?a.  M?t s? lo?i thu?c c? th?.  Nh?ng b?nh n?ng, ch?ng h?n nh? vim gan, b?nh th?n, v m?t s? r?i lo?n nh?t ??nh v? ?n u?ng.  T?ng ho?t ??ng ho?c t?p th? d?c m khng ?n thm ho?c ?i?u ch?nh thu?c.  U?ng qu nhi?u r??u.  R?i lo?n th?n kinh ?nh h??ng ??n ch?c n?ng c? th? nh? nh?p tim, huy?t p, v tiu ha (b?nh th?n kinh th?c v?t).  M?t tnh tr?ng b?nh l trong ? cc c? d? dy ho?t ??ng khng bnh th??ng (li?t d? dy). Do ?, thu?c v th?c ?n c th? khng ???c dung n?p bnh th??ng.  Tr??ng h?p hi?m g?p l m?t kh?i u ? tuy?n t?y c th? t?o ra qu nhi?u insulin. TRI?U CH?NG   ?i.  ?? m? hi (v m? hi).  Thay ??i thn nhi?t.  Run r?y.  ?au ??u.  Lo u.  Chong vng.  D? b? kch thch.  Kh t?p trung.  Kh mi?ng.  ?au bu?t ho?c t ? bn tay ho?c bn chn.  Ng? khng yn gi?c ho?c ng? khng ngon.  Thay ??i l?i ni v s? ph?i h?p.  Thay ??i trong tnh tr?ng tm th?n.  ??ng kinh ho?c co gi?t ko di.  Thch gy g?.  Bu?n ng? (ng? l?m).  Y?u.  Nh?p tim t?ng nhanh ho?c ?nh tr?ng ng?c.  B? l l?n.  N??c da ti, xm.  Nhn m? ho?c  nhn m?t thnh hai.  Ng?t x?u. CH?N ?ON  Th?c hi?n khm th?c th? v xem b?nh s?. Chuyn gia ch?m [place] s?c kh?e c th? ch?n ?on d?a trn cc tri?u ch?ng c?a qu v?. Xt nghi?m mu v cc xt nghi?m khc c th? ???c lm ?? xc nh?n ch?n ?on. Khi ? c ch?n ?on, chuyn gia ch?m [place] y t? s? xem li?u cc d?u hi?u v tri?u ch?ng c?a qu v? c h?t khng khi l??ng glucose trong mu t?ng ln.  ?I?U TR?  Thng th??ng qu v? c th? d? dng ?i?u tr? ch?ng h? ???ng huy?t khi qu v? th?y c tri?u ch?ng.  Ki?m tra l??ng glucose trong mu c?a qu v?. N?u d??i 70 mg/dl, hy dng m?t trong nh?ng th? sau:  3-4 vin glucose.   c?c n??c tri cy.   c?c s ?a thng th??ng.  1 c?c s?a tch bo.  -1 ?ng glucose gel.  5-6 ci k?o c?ng.  Trnh cc ?? u?ng nhi?u ch?t bo ho?c th?c ?n m c th?  lm ch?m qu trnh t?ng l??ng glucose trong mu.  Khng dng l??ng th?c ?n, ?? u?ng, gel, ho?c vin c ???ng nhi?u h?n ch? ??nh. Lm nh? v?y s? lm l??ng glucose trong mu c?a qu v? t?ng qu cao.  ??i 10-15 pht v ki?m tra l?i l??ng glucose trong mu. N?u l??ng glucose v?n th?p h?n 70 mg/dl ho?c d??i m?c m?c tiu, hy ?i?u tr? ti?p.  ?n m?t b?a ?n nh? n?u cn 1 ti?ng n?a m?i ??n b?a ?n ti?p theo c?a qu v?. S? c lc m l??ng glucose trong mu c?a qu v? c th? xu?ng th?p ??n m?c qu v? khng th? t? ?i?u tr? cho mnh ? nh khi qu v? b?t ??u nh?n th?y c tri?u ch?ng. Qu v? c th? c?n ai ? gip qu v?. Qu v? th?m ch c th? ng?t ho?c khng th? nu?t ???c. N?u qu v? khng th? t? ?i?u tr?, s? c?n c ng??i ??a qu v? ??n b?nh vi?n.  H??NG D?N CH?M [place] T?I NH  N?u qu v? b? ti?u ???ng, hy lm theo k? ho?ch qu?n l ti?u ???ng b?ng cch:  Dng thu?c theo ch? d?n.  Lm theo k? ho?ch t?p luy?n.  Lm theo k? ho?ch ?n u?ng. Khng b? b?a. ?n ?ng gi?.  Ki?m tra l??ng glucose trong mu th??ng xuyn. Ki?m tra l??ng glucose trong mu tr??c v sau khi t?p th? d?c. N?u vi?c t?p th? d?c ko di ho?c khc v?i bnh  th??ng, hy b?o ??m ki?m tra l??ng glucose trong mu th??ng xuyn h?n.  ?eo ?? trang s?c c?nh bo y t? m c th? cho bi?t qu v? b? ti?u ???ng.  Xc ??nh nguyn nhn gy h? ???ng huy?t. Sau ?, ??a ra cc cch ?? trnh ti di?n ch?ng h? ???ng huy?t.  Khng t?m b?n ho?c vi sen n??c nng ngay sau khi tim insulin.  Lun mang theo thu?c ?i?u tr?. Cc vin glucose l d? mang theo nh?t.  N?u qu v? s? u?ng r??u, hy ch? u?ng r??u khi ?n.  Ni cho b?n b ho?c ng??i trong gia ?nh cc cch lm cho qu v? an ton trong lc b? ??ng kinh. Vi?c ny c th? bao g?m lo?i b? cc ?? v?t c?ng v s?c ra kh?i ch? c?a qu v? ho?c l?t qu v? n?m nghing.  Duy tr cn n?ng c l?i cho s?c kh?e. ?I KHM N?U:   Qu v? g?p v?n ?? v?i vi?c gi? l??ng glucose trong mu ? ph?m vi m?c tiu.  Qu v? ?ang c nh?ng c?n h? ???ng huy?t th??ng xuyn.  Qu v? c?m th?y mnh c th? ?ang c ph?n ?ng ph? v?i thu?c.  Qu v? khng bi?t ch?c v sao l??ng glucose trong mu c?a qu v? gi?m th?p nh? v?y.  Qu v? nh?n th?y thay ??i v? th? l?c ho?c m?t v?n ?? m?i v?i th? l?c c?a qu v?. NGAY L?P T?C ?I KHM N?U:   B? l l?n.  Thay ??i trong tnh tr?ng tm th?n.  Khng th? nu?t.  B? ng?t. Document Released: 04/25/2005 Document Revised: 04/30/2013 [facility] Patient Information 2015 [place]. This information is not intended to replace advice given to you by your health care provider. Make sure you discuss any questions you have with your health care provider.   Nhi?m Trng ???ng Ti?t Ni?u (Urinary Tract Infection) Nhi?m trng ???ng ti?t ni?u (UTI) c th? pht tri?n ?? b?t c? vi? tri? na?o d?c ???ng ti?t ni?u. ???ng ti?t ni?u l h?  th?ng thot n??c c?a c? th? ?? lo?i b? ch?t th?i v n??c d? th?a. ???ng ti?t ni?u bao g?m hai th?n, ni?u qu?n, bng quang v ni?u ??o. Th?n l m?t c?p c? quan hnh h?t ??u. M?i th?n co? kch th??c kho?ng b?ng bn tay c?a b?n. Chng n?m d??i x??ng s??n, m?i bn c?t s?ng c m?t qu?[place] Sledge  NHN Nhi?m trng gy b?i vi sinh v?t, l cc sinh v?t nh?, bao g?m n?m, vi rt v vi khu?n. Nh?ng sinh v?t ny nh? t?i m?c chng ch? c th? ???c nhn th?y qua knh hi?n vi. Vi khu?n l nh?ng vi sinh v?t ph? bi?n nh?t gy ra UTI. TRI?U CH?NG Cc tri?u ch?ng c?a UTI c th? khc nhau, ty theo ?? tu?i v gi?i tnh c?a b?nh nhn v b?i v? tr nhi?m trng. Cc tri?u ch?ng ? ph? n? tr? th??ng bao g?m nhu c?u ti?u ti?n th??ng xuyn v d? d?i, c?m gic ?au v rt ? bng quang ho?c ni?u ??o khi ?i ti?u. Ph? n? v nam gi?i l?n tu?i c nhi?u kh? n?ng b? m?t m?i, run r?y v y?u, ??ng th?i b? ?au c? v ?au b?ng. S?t c th? c ngh?a l nhi?m trng ? th?n. Cc tri?u ch?ng khc c?a nhi?m trng th?n bao g?m ?au ? l?ng ho?c hai bn d??i x??ng s??n, bu?n nn v nn m?a. CH?N ?ON ?? ch?n ?on UTI, chuyn gia ch?m [place] s?c kh?e s? h?i b?n v? nh?ng tri?u ch?ng c?a b?n. Chuyn gia ch?m [place] s?c kh?e c?ng s? yu c?u cung c?p m?u n??c ti?u. M?u n??c ti?u s? ???c xt nghi?m xem c vi khu?n v cc t? bo mu tr?ng khng. Cc t? bo mu tr?ng ???c t?o b?i c? th? ?? gip ch?ng l?i nhi?m trng. ?I?U TR? Thng th??ng, UTI c th? ???c ?i?u tr? b?ng thu?c. B?i v h?u h?t nguyn nhn gy ra UTI l do nhi?m trng b?i vi khu?n, chng th??ng c th? ???c ?i?u tr? b?ng cch s? d?ng thu?c khng sinh. Vi?c l?a ch?n khng sinh v th?i gian ?i?u tr? ph? thu?c vo tri?u ch?ng v lo?i vi khu?n gy nhi?m trng. H??NG D?N CH?M [place] T?I NH  N?u b?n ? ???c k khng sinh, hy s? d?ng chng ?ng nh? chuyn gia ch?m [place] s?c kh?e h??ng d?n. S? d?ng h?t thu?c ngay c? khi b?n c?m th?y kh h?n sau khi ch? dng m?t ph?n thu?c.  U?ng ?? n??c v dung d?ch ?? n??c ti?u trong ho?c c mu vng nh?t.  Trnh caffeine, tr v ?? u?ng c ga. Chng c xu h??ng kch thch bng quang.  ?i ti?u th??ng xuyn. Trnh nh?n ti?u trong th?i gian di.  ?i ti?u tr??c v sau khi quan h? tnh d?c.  Sau khi ?i ??i ti?n, ph? n? c?n lm s?ch t? tr??c ra sau. Ch? s? d?ng gi?y v?  sinh m?t l?n. HY ?I KHM N?U:  B?n b? ?au l?ng.  B?n b? s?t.  Cc tri?u ch?ng c?a b?n khng b?t ??u ?? trong vng 3 ngy. HY NGAY L?P T?C ?I KHM N?U:  B?n b? ?au l?ng ho?c ?au b?ng d??i nghim tr?ng.  B?n b? ?n l?nh.  B?n b? bu?n nn ho?c nn m?a.  B?n lin t?c b? rt ho?c kh ch?u khi ?i ti?u. ??M B?O B?N:  Hi?u cc h??ng d?n ny.  S? theo di tnh tr?ng c?a mnh.  S? yu c?u tr? gip ngay l?p t?c n?u b?n c?m th?y khng ?? ho?c  tnh tr?ng tr?m tr?ng h?n. Document Released: 04/25/2005 Document Revised: 12/26/2012 [facility] Patient Information 2015 [place]. This information is not intended to replace advice given to you by your health care provider. Make sure you discuss any questions you have with your health care provider.   Ch?ng Chng M?t ([place]) Chng m?t c ngh?a l b?n c?m th?y nh? b?n hay m?i th? xung quanh b?n ?ang di chuy?n khi th?c t? khng ph?i nh? v?y. Chng m?t c th? nguy hi?m n?u n x?y ra khi b?n ?ang ? n?i lm vi?c, li xe ho?c th?c hi?n cc ho?t ??ng kh kh?n. NGUYN NHN Chng m?t x?y ra khi c s? xung ??t c?a cc tn hi?u ???c g?i ln no t? h? th?ng th? gic v h? th?ng c?m gic trong c? th?. C nhi?u nguyn nhn khc nhau gy chng m?t, bao g?m:  Nhi?m trng, ??c bi?t l ? tai trong.  Ph?n ?ng x?u v?i m?t lo?i thu?c ho?c l?m d?ng r??u v thu?c men.  Cai ma ty ho?c r??u.  Thay ??i t? th? nhanh, ch?ng h?n nh? n?m xu?ng ho?c l?n mnh trn gi??ng.  ?au n?a ??u.  L?u l??ng mu ln no gi?m.  p l?c trong no t?ng sau ch?n th??ng ??u, nhi?m trng, kh?i u ho?c ch?y mu. TRI?U CH?NG B?n c th? c?m th?y c v? nh? l th? gi?i ?ang quay cu?ng ho?c b?n ?ang r?i xu?ng ??t. B?i v s? cn b?ng c?a b?n b? ??o l?n, chng m?t c th? khi?n b?n bu?n nn v nn m?a. B?n c th? c c? ??ng m?t ngoi  mu?n (gi?t c?u m?t). CH?N ?ON Chng m?t th??ng ???c ch?n ?on b?ng cch khm th?c th?. N?u khng xc ??nh ???c nguyn nhn gy chng m?t, chuyn gia ch?m  [place] s?c kh?e c th? th?c hi?n cc xt nghi?m hnh ?nh, ch?ng h?n nh? ch?p MRI (ch?p c?ng h??ng t?). ?I?U TR? H?u h?t cc tr??ng h?p chng m?t t? chng h?t m khng c?n ?i?u tr?Marland Kitchen Ty thu?c vo nguyn nhn, chuyn gia ch?m [place] s?c kh?e c th? k m?t s? thu?c nh?t ??nh. N?u chng m?t c lin quan ??n cc v?n ?? v? v? tr c? th?, chuyn gia ch?m [place] s?c kh?e c th? ?? xu?t cc ??ng tc ho?c ph??ng php ?? kh?c ph?c v?n ?? ny. Trong m?t s? tr??ng h?p hi?m g?p, n?u chng m?t do m?t s? v?n ?? v? tai trong, b?n c th? c?n ph?u thu?t. H??NG D?N CH?M [place] T?I NH  Lm theo h??ng d?n c?a bc s?Hessie Diener li xe.  Trnh v?n hnh my mc n?ng.  Trnh th?c hi?n b?t c? nhi?m v? no c th? gy nguy hi?m cho b?n ho?c nh?ng ng??i khc trong tnh tr?ng chng m?t.  Ni cho chuyn gia ch?m [place] s?c kh?e bi?t n?u b?n nh?n th?y lo?i thu?c no ? d??ng nh? l nguyn nhn gy chng m?t cho b?n. M?t s? thu?c ???c dng ?? ?i?u tr? tnh tr?ng chng m?t th?c s? c th? lm cho chng t?i t? h?n ? m?t s? ng??i. HY NGAY L?P T?C ?I KHM N?U:  Thu?c c v? nh? khng c tc d?ng lm h?t c?n chng m?t ho?c lm b?nh n?ng thm.  B?n c v?n ?? v? ni chuy?n, ?i b?, ?m y?u ho?c vi?c s? d?ng cnh tay, bn tay hay chn.  B?n b? ?au ??u r?t nhi?u.  Bu?n nn ho?c nn lin t?c ho?c n?ng h?n.  B?n b? thay ??i th? gic.  Thnh vin trong gia ?nh nh?n th?y cc thay ??i hnh vi.  Tnh tr?ng c?a b?n tr? nn n?ng h?n. ??M B?O B?N:  Hi?u cc h??ng d?n ny.  S? theo di tnh tr?ng c?a mnh.  S? yu c?u tr? gip ngay l?p t?c n?u b?n c?m th?y khng ?? ho?c tnh tr?ng tr?m tr?ng h?n. Document Released: 04/25/2005 Document Revised: 12/26/2012 [facility] Patient Information 2015 [place]. This information is not intended to replace advice given to you by your health care provider. Make sure you discuss any questions you have with your health care provider.

## 2014-08-06 NOTE — Progress Notes (Signed)
Chief Complaint:  Chief Complaint  Patient presents with  . Follow-up    Dr. Conley RollsLe    HPI: April Gibson is a 39 y.o. female who is here for  recheck of dizziness and arm numbness and tingling. She has been evaluated multiple times for dizziness. The last visit she was given Cipro 250 mg by mouth twice a day for acute cystitis and also meclizine for dizziness. Her symptoms have improved some. She still has intermittent dizziness which can occur randomly or with postural changes. Her numbness and tingling are resolved for the most part. 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 She drinks about 3 bottles of 16 ounces of water a day. When she is working sometimes she has to look up with her neck and sometimes that causes more numbness and tingling going down her arms. She denies any chest pain, shortness of breath, palpitations. She is here today because she wants know she can get blood work to test for diabetes. She wants to know if there's anything is going on and why she feels this way. There has been no new neurological changes, no acute changes from when she was here last. She feels better than when she was here before. She ate about 3-4 hours ago. She had noodles.  Past Medical History  Diagnosis Date  . 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 monitor labs yearly   History reviewed. No pertinent past surgical history. History   Social History  . Marital Status: Married    Spouse Name: N/A  . Number of Children: N/A  . Years of Education: N/A   Social History Main Topics  . Smoking status: Never Smoker   . Smokeless tobacco: Not on file  . Alcohol Use: No  . Drug Use: No  . Sexual Activity: Not on file   Other Topics Concern  . None   Social History Narrative   History reviewed. No pertinent family history. No Known Allergies Prior to Admission medications     Medication Sig Start Date End Date Taking? Authorizing Provider  Multiple Vitamin (MULTIVITAMIN) tablet Take 1 tablet by mouth daily.   Yes Historical Provider, MD  ciprofloxacin (CIPRO) 250 MG tablet Take 1 tablet (250 mg total) by mouth 2 (two) times daily. Patient not taking: Reported on 08/06/2014 07/18/14   Elvina SidleKurt Lauenstein, MD     ROS: The patient denies fevers, chills, night sweats, unintentional weight loss, chest pain, palpitations, wheezing, dyspnea on exertion, nausea, vomiting, abdominal pain, dysuria, hematuria, melena  All other systems have been reviewed and were otherwise negative with the exception of those mentioned in the HPI and as above.    PHYSICAL EXAM: Filed Vitals:   08/06/14 1857  BP: 110/72  Pulse: 89  Temp: 98.4 F (36.9 C)  Resp: 18   Filed Vitals:   08/06/14 1857  Height: 4\' 11"  (1.499 m)  Weight: 118 lb 6.4 oz (53.706 kg)   Body mass index is 23.9 kg/(m^2).  General: Alert, no acute distress HEENT:  Normocephalic, atraumatic, oropharynx patent. EOMI, PERRLA, funduscopic exam normal TM normal no exudates in the oropharynx , no scleral icterus Cardiovascular:  Regular rate and rhythm, no rubs murmurs or gallops.  No Carotid bruits, radial pulse intact. No pedal edema.  Respiratory: Clear to auscultation bilaterally.  No wheezes, rales, or rhonchi.  No cyanosis, no use of accessory  musculature GI: No organomegaly, abdomen is soft and non-tender, positive bowel sounds.  No masses. Skin: No rashes. Neurologic: Facial musculature symmetric.cranial nerves II-12 grossly intact Psychiatric: Patient is appropriate throughout our interaction. Lymphatic: No cervical lymphadenopathy Musculoskeletal: Gait intact. 5 out of 5 strength, 2 out of 2 DTRs She has some mild dizziness with range of motion of her head, but not with EOM Neck exam was normal. Negative Spurling  LABS: Results for orders placed or performed in visit on 08/06/14  POCT UA - Microscopic Only   Result Value Ref Range   WBC, Ur, HPF, POC neg    RBC, urine, microscopic neg    Bacteria, U Microscopic neg    Mucus, UA neg    Epithelial cells, urine per micros 0-1    Crystals, Ur, HPF, POC neg    Casts, Ur, LPF, POC neg    Yeast, UA neg   POCT urinalysis dipstick  Result Value Ref Range   Color, UA yellow    Clarity, UA clear    Glucose, UA neg    Bilirubin, UA neg    Ketones, UA neg    Spec Grav, UA 1.015    Blood, UA neg    pH, UA 7.0    Protein, UA neg    Urobilinogen, UA 0.2    Nitrite, UA neg    Leukocytes, UA small (1+)   POCT glycosylated hemoglobin (Hb A1C)  Result Value Ref Range   Hemoglobin A1C 4.6   POCT glucose (manual entry)  Result Value Ref Range   POC Glucose 69 (A) 70 - 99 mg/dl     EKG/XRAY:   Primary read interpreted by Dr. Conley Rolls at [facility].   ASSESSMENT/PLAN: Encounter Diagnoses  Name Primary?  . UTI (urinary tract infection), uncomplicated Yes  . Dizziness and giddiness   . Hypokalemia   . Hypoglycemia   . Acute cystitis without hematuria    39 year old April Gibson ([place]) female with a past medical history of hepatitis B who presents for recheck of her dizziness and numbness and tingling in her arms. #1. I suspect her dizziness is related to recurrence of UTI/cystitis and possibly BPPV and also hypoglycemia and dehydration-will treat with Cipro 500 mg and meclizine. Advised to push fluids, also eat for with hyper routine and more frequently. She should snack on almonds to see if that will help both her hypoglycemia. She ate about 3 hours ago but her sugar level at this time is 69. 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 It does not happen consistently. #2. We will recheck labs for hypokalemia, also will culture her urine.  Gross sideeffects, risk and benefits, and alternatives of medications d/w patient. Patient is  aware that all medications have potential sideeffects and we are unable to predict every sideeffect or drug-drug interaction that may occur.  Hamilton Capri PHUONG, DO 08/06/2014 8:17 PM

## 2014-08-07 LAB — COMPLETE METABOLIC PANEL WITH GFR
ALT: 30 U/L (ref 0–35)
AST: 28 U/L (ref 0–37)
Albumin: 4.5 g/dL (ref 3.5–5.2)
Calcium: 9.5 mg/dL (ref 8.4–10.5)
Chloride: 103 mEq/L (ref 96–112)
GFR, Est African American: >89 mL/min
Potassium: 3.7 mEq/L (ref 3.5–5.3)
Sodium: 136 mEq/L (ref 135–145)
Total Protein: 8.1 g/dL (ref 6.0–8.3)

## 2014-08-07 LAB — CBC
HCT: 40.5 % (ref 36.0–46.0)
Hemoglobin: 13.4 g/dL (ref 12.0–15.0)
MCH: 30.2 pg (ref 26.0–34.0)
MCHC: 33.1 g/dL (ref 30.0–36.0)
MCV: 91.4 fL (ref 78.0–100.0)
MPV: 10 fL (ref 8.6–12.4)
Platelets: 303 10*3/uL (ref 150–400)
RBC: 4.43 MIL/uL (ref 3.87–5.11)
RDW: 12.6 % (ref 11.5–15.5)
WBC: 14.4 10*3/uL — ABNORMAL HIGH (ref 4.0–10.5)

## 2014-08-07 LAB — COMPLETE METABOLIC PANEL WITHOUT GFR
Alkaline Phosphatase: 57 U/L (ref 39–117)
BUN: 10 mg/dL (ref 6–23)
CO2: 26 meq/L (ref 19–32)
Creat: 0.67 mg/dL (ref 0.50–1.10)
GFR, Est Non African American: >89 mL/min
Glucose, Bld: 77 mg/dL (ref 70–99)
Total Bilirubin: 0.5 mg/dL (ref 0.2–1.2)

## 2014-08-08 LAB — URINE CULTURE: Colony Count: 5000

## 2014-08-09 ENCOUNTER — Ambulatory Visit (INDEPENDENT_AMBULATORY_CARE_PROVIDER_SITE_OTHER): Payer: 59 | Admitting: Family Medicine

## 2014-08-09 VITALS — BP 108/74 | HR 88 | Temp 98.4°F | Resp 16 | Ht 59.0 in | Wt 119.0 lb

## 2014-08-09 DIAGNOSIS — R42 Dizziness and giddiness: Secondary | ICD-10-CM | POA: Diagnosis not present

## 2014-08-09 LAB — POCT CBC
GRANULOCYTE PERCENT: 69.1 % (ref 37–80)
HEMATOCRIT: 43.5 % (ref 37.7–47.9)
Hemoglobin: 13.8 g/dL (ref 12.2–16.2)
LYMPH, POC: 2.6 (ref 0.6–3.4)
MCH: 29.4 pg (ref 27–31.2)
MCHC: 31.9 g/dL (ref 31.8–35.4)
MCV: 92.1 fL (ref 80–97)
MID (CBC): 0.6 (ref 0–0.9)
MPV: 7.6 fL (ref 0–99.8)
POC Granulocyte: 7.3 — AB (ref 2–6.9)
POC LYMPH PERCENT: 24.8 %L (ref 10–50)
POC MID %: 6.1 %M (ref 0–12)
Platelet Count, POC: 295 10*3/uL (ref 142–424)
RBC: 4.72 M/uL (ref 4.04–5.48)
RDW, POC: 13.4 %
WBC: 10.6 10*3/uL — AB (ref 4.6–10.2)

## 2014-08-09 LAB — GLUCOSE, POCT (MANUAL RESULT ENTRY): POC GLUCOSE: 96 mg/dL (ref 70–99)

## 2014-08-09 MED ORDER — MECLIZINE HCL 25 MG PO TABS: 25.0000 mg | ORAL_TABLET | Freq: Two times a day (BID) | ORAL | Status: DC | PRN

## 2014-08-09 NOTE — Patient Instructions (Signed)
Use the meclizine as needed for your dizziness.  I will refer you to see a neurologist for your dizziness.  Let us know if you are getting worse in the meantime

## 2014-08-09 NOTE — Progress Notes (Signed)
[facility] [address], [place] [address] [phone]  Date:  08/09/2014   Name:  April Gibson   DOB:  1976/01/17   MRN:  [id number]  PCP:  No PCP Per Patient    Chief Complaint: Follow-up   History of Present Illness:  April Gibson is a 39 y.o. very pleasant female patient who presents with the following:  Here today for a recheck - she was here on 3/30 with complaint of dizziness, was treated with cipro for possible UTI.  She is here today because she felt dizzy again while she was at work today.   She has had dizziness off an on for a couple of years, has been seen a few times in clinic with this complaint.   The dizziness happened when she moved her head.  She was sitting down, then "looked up and down and I felt dizzy."  She was noted to have leukocytosis and mild hypoglycemia when she was here on Wednesday.   She describes the dizzines as a feeling of "wanting to fall down," she is not able to describe it further.  It does not seem to be a vertigo or spinning sensation.  She cannot really tell me how long this feeling lasted but it seemed to be gone in 5-10 minutes  Patient Active Problem List   Diagnosis Date Noted  . 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 monitor labs yearly    History reviewed. No pertinent past surgical history.  History  Substance Use Topics  . Smoking status: Never Smoker   . Smokeless tobacco: Not on file  . Alcohol Use: No    History reviewed. No pertinent family history.  No Known Allergies  Medication list has been reviewed and updated.  Current Outpatient Prescriptions on File Prior to Visit  Medication Sig Dispense Refill  . ciprofloxacin (CIPRO) 500 MG tablet Take 1 tablet (500 mg total) by mouth 2 (two) times daily. 14  tablet 0  . Multiple Vitamin (MULTIVITAMIN) tablet Take 1 tablet by mouth daily.     No current facility-administered medications on file prior to visit.    Review of Systems:  As per HPI- otherwise negative.   Physical Examination: Filed Vitals:   08/09/14 1630  BP: 108/74  Pulse: 88  Temp: 98.4 F (36.9 C)  Resp: 16   Filed Vitals:   08/09/14 1630  Height:  (1.499 m)  Weight: 119 lb (53.978 kg)   Body mass index is 24.02 kg/(m^2). Ideal Body Weight: Weight in (lb) to have BMI = 25: 123.5  GEN: WDWN, NAD, Non-toxic, A & O x 3, petite build, looks well HEENT: Atraumatic, Normocephalic. Neck supple. No masses, No LAD.  Bilateral TM wnl, oropharynx normal.  PEERL,EOMI.   No meningismus Ears and Nose: No external deformity. CV: RRR, No M/G/R. No JVD. No thrill. No extra heart sounds. PULM: CTA B, no wheezes, crackles, rhonchi. No retractions. No resp. distress. No accessory muscle use. ABD: S, NT, ND EXTR: No c/c/e NEURO Normal gait. Normal strength, sensation and DTR all extremities, normal romberg PSYCH: Normally interactive. Conversant. Not depressed or anxious appearing.  Calm demeanor.   Results for orders placed or performed in visit on 08/09/14  POCT CBC  Result Value Ref Range   WBC  10.6 (A) 4.6 - 10.2 K/uL   Lymph, poc 2.6 0.6 - 3.4   POC LYMPH PERCENT 24.8 10 - 50 %L   MID (cbc) 0.6 0 - 0.9   POC MID % 6.1 0 - 12 %M   POC Granulocyte 7.3 (A) 2 - 6.9   Granulocyte percent 69.1 37 - 80 %G   RBC 4.72 4.04 - 5.48 M/uL   Hemoglobin 13.8 12.2 - 16.2 g/dL   HCT, POC 16.143.5 09.637.7 - 47.9 %   MCV 92.1 80 - 97 fL   MCH, POC 29.4 27 - 31.2 pg   MCHC 31.9 31.8 - 35.4 g/dL   RDW, POC 04.513.4 %   Platelet Count, POC 295 142 - 424 K/uL   MPV 7.6 0 - 99.8 fL  POCT glucose (manual entry)  Result Value Ref Range   POC Glucose 96 70 - 99 mg/dl   Assessment and Plan: Dizziness and giddiness - Plan: POCT CBC, POCT glucose (manual entry), meclizine (ANTIVERT) 25 MG  tablet, Ambulatory referral to Neurology   Here today with recurrent dizziness.  Per previous notes it seems that this is a frequent concern of hers.   Will refer to neurology for further evaluation although doubt any dangerous pathology Reassured that her labs look fine today Refilled her meclizine for prn dizziness per her request   Signed Abbe AmsterdamJessica Copland, MD

## 2014-08-11 ENCOUNTER — Telehealth: Payer: Self-pay | Admitting: Family Medicine

## 2014-08-11 NOTE — Telephone Encounter (Signed)
Spoke with patient, feeling better, she has less dizziness, has been taking both cipro and also meclizine.

## 2014-09-20 ENCOUNTER — Ambulatory Visit (INDEPENDENT_AMBULATORY_CARE_PROVIDER_SITE_OTHER): Payer: 59 | Admitting: Physician Assistant

## 2014-09-20 VITALS — BP 98/60 | HR 93 | Temp 99.2°F | Ht 59.0 in | Wt 124.2 lb

## 2014-09-20 DIAGNOSIS — R42 Dizziness and giddiness: Secondary | ICD-10-CM | POA: Diagnosis not present

## 2014-09-20 DIAGNOSIS — R51 Headache: Secondary | ICD-10-CM | POA: Diagnosis not present

## 2014-09-20 LAB — POCT CBC
GRANULOCYTE PERCENT: 75.6 % (ref 37–80)
HEMATOCRIT: 40.8 % (ref 37.7–47.9)
HEMOGLOBIN: 13.2 g/dL (ref 12.2–16.2)
LYMPH, POC: 2.3 (ref 0.6–3.4)
MCH: 29.8 pg (ref 27–31.2)
MCHC: 32.4 g/dL (ref 31.8–35.4)
MCV: 92 fL (ref 80–97)
MID (CBC): 0.7 (ref 0–0.9)
MPV: 7.5 fL (ref 0–99.8)
POC GRANULOCYTE: 9.2 — AB (ref 2–6.9)
POC LYMPH PERCENT: 18.9 %L (ref 10–50)
POC MID %: 5.5 %M (ref 0–12)
Platelet Count, POC: 260 10*3/uL (ref 142–424)
RBC: 4.44 M/uL (ref 4.04–5.48)
RDW, POC: 12.8 %
WBC: 12.2 10*3/uL — AB (ref 4.6–10.2)

## 2014-09-20 LAB — TSH: TSH: 1.212 u[IU]/mL (ref 0.350–4.500)

## 2014-09-20 LAB — POCT URINALYSIS DIPSTICK
BILIRUBIN UA: NEGATIVE
GLUCOSE UA: NEGATIVE
KETONES UA: NEGATIVE
Leukocytes, UA: NEGATIVE
Nitrite, UA: NEGATIVE
Protein, UA: NEGATIVE
RBC UA: NEGATIVE
SPEC GRAV UA: 1.015
Urobilinogen, UA: 0.2
pH, UA: 7.5

## 2014-09-20 LAB — POCT UA - MICROSCOPIC ONLY
CRYSTALS, UR, HPF, POC: NEGATIVE
Casts, Ur, LPF, POC: NEGATIVE
MUCUS UA: NEGATIVE
RBC, URINE, MICROSCOPIC: NEGATIVE
YEAST UA: NEGATIVE

## 2014-09-20 LAB — GLUCOSE, POCT (MANUAL RESULT ENTRY): POC Glucose: 108 mg/dl — AB (ref 70–99)

## 2014-09-20 LAB — POCT SEDIMENTATION RATE: POCT SED RATE: 28 mm/hr — AB (ref 0–22)

## 2014-09-20 NOTE — Patient Instructions (Signed)
Please stay hydated.  You should be seen by neurology in the next week.  Please answer any phone calls from our office regarding this referral.

## 2014-09-20 NOTE — Progress Notes (Signed)
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  SUBJECTIVE  Chief complaint: Headache and Dizziness  Patient here today for eval of a dizziness episode that occurred at work yesterday.  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  She has a history of this happening and says that yesterday was particularly bad. She feels that she can usually control her symptoms, and feels mildly dizzy most of the time.  She feels that her vision is slightly off today.  She has had a negative EKG in the last two months and a normal CMET in same. She has had a few hypoglycemic reads in the clinic.  She reports she did eat and drink her normal yesterday.  She denies a history of seizure.   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    Medications reviewed and updated by myself where necessary, and exist elsewhere in the encounter.   Ms. Janee Mornhach has No Known Allergies. She  reports that she has never smoked. She does not have any smokeless tobacco history on file. She reports that she does not drink alcohol or use illicit drugs. She  has no sexual activity history on file. The patient  has no past surgical history on file.  Her family history is not on file.  Review of Systems  Constitutional: Negative for fever and chills.  Eyes: Negative for pain and redness.  Respiratory: Negative for cough.   Cardiovascular: Negative for chest pain.  Gastrointestinal: Positive for nausea. Negative for heartburn, vomiting and abdominal pain.  Genitourinary: Negative for dysuria.  Musculoskeletal: Negative for myalgias.  Skin: Negative for rash.  Neurological: Positive for dizziness and headaches. Negative for tingling and focal weakness.    OBJECTIVE  Her   height is 4\' 11"  (1.499 m) and weight is 124 lb 3.2 oz (56.337 kg). Her oral temperature is 99.2 F (37.3 C). Her blood pressure is 98/60 and her pulse is 93. Her oxygen saturation is 98%.  The patient's body mass index is 25.07 kg/(m^2).  Physical Exam  Vitals reviewed. Constitutional: She is oriented to person, place, and time. She appears well-developed and well-nourished. No distress.  HENT:  Right Ear: Hearing, tympanic membrane, external ear and ear canal normal. Tympanic membrane is not retracted.  Left Ear: Hearing, tympanic membrane, external ear and ear canal normal. Tympanic membrane is not retracted.  Ears:  Nose: Mucosal edema present. Right sinus exhibits no maxillary sinus tenderness and no frontal sinus tenderness. Left sinus exhibits no maxillary sinus tenderness and no frontal sinus tenderness.  Mouth/Throat: Uvula is midline, oropharynx is clear and moist and mucous membranes are normal.    Eyes: No scleral icterus.  Cardiovascular: Normal rate, regular rhythm and normal heart sounds.   Respiratory: Effort normal and breath sounds normal. She has no wheezes. She has no rales.  Neurological: She is alert and oriented to person, place, and time.  RAM, heel and toe, heel to shin, finger to nose, vibration and position sense intact.    Skin: Skin is warm and dry. She is not diaphoretic.  Psychiatric: She has a normal mood and affect.    Results for orders placed or performed in visit on 09/20/14 (from the past 24 hour(s))  TSH  Status: None   Collection Time: 09/20/14  1:37 PM  Result Value Ref Range   TSH 1.212 0.350 - 4.500 uIU/mL   Narrative   Performed at:  [facility]                [address], [address]                [place], [place] [id number]  POCT urinalysis dipstick     Status: Normal   Collection Time: 09/20/14  2:04 PM  Result Value Ref Range   Color, UA yellow    Clarity, UA clear    Glucose, UA negative    Bilirubin, UA negative     Ketones, UA negative    Spec Grav, UA 1.015    Blood, UA negative    pH, UA 7.5    Protein, UA negative    Urobilinogen, UA 0.2    Nitrite, UA negative    Leukocytes, UA Negative   POCT UA - Microscopic Only     Status: None   Collection Time: 09/20/14  2:04 PM  Result Value Ref Range   WBC, Ur, HPF, POC 0-1    RBC, urine, microscopic negative    Bacteria, U Microscopic trace    Mucus, UA negative    Epithelial cells, urine per micros 3-20    Crystals, Ur, HPF, POC negative    Casts, Ur, LPF, POC negative    Yeast, UA negative   POCT glucose (manual entry)     Status: Abnormal   Collection Time: 09/20/14  2:04 PM  Result Value Ref Range   POC Glucose 108 (A) 70 - 99 mg/dl  POCT CBC     Status: Abnormal   Collection Time: 09/20/14  2:04 PM  Result Value Ref Range   WBC 12.2 (A) 4.6 - 10.2 K/uL   Lymph, poc 2.3 0.6 - 3.4   POC LYMPH PERCENT 18.9 10 - 50 %L   MID (cbc) 0.7 0 - 0.9   POC MID % 5.5 0 - 12 %M   POC Granulocyte 9.2 (A) 2 - 6.9   Granulocyte percent 75.6 37 - 80 %G   RBC 4.44 4.04 - 5.48 M/uL   Hemoglobin 13.2 12.2 - 16.2 g/dL   HCT, POC 44.040.8 10.237.7 - 47.9 %   MCV 92.0 80 - 97 fL   MCH, POC 29.8 27 - 31.2 pg   MCHC 32.4 31.8 - 35.4 g/dL   RDW, POC 72.512.8 %   Platelet Count, POC 260 142 - 424 K/uL   MPV 7.5 0 - 99.8 fL  POCT SEDIMENTATION RATE     Status: Abnormal   Collection Time: 09/20/14  4:36 PM  Result Value Ref Range   POCT SED RATE 28 (A) 0 - 22 mm/hr    ASSESSMENT & PLAN  Kathrene was seen today for headache and dizziness.  Diagnoses and all orders for this visit:  Dizziness: Work up and examination unrevealing.  She has past labs showing hypoglycemia.  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  Sed rate reassuring as well as neurological exam.  Will move patient's neurological referral up by sending patient to .   Orders: -     POCT urinalysis dipstick -     POCT UA - Microscopic  Only -     POCT glucose (manual entry) -     POCT CBC -  TSH -     POCT SEDIMENTATION RATE -     Ambulatory referral to Neurology    The patient was advised to call or come back to clinic if she does not see an improvement in symptoms, or worsens with the above plan.   Deliah Boston, MHS, PA-C [facility] 09/21/2014 8:18 AM

## 2014-09-30 ENCOUNTER — Encounter: Payer: Self-pay | Admitting: Neurology

## 2014-09-30 ENCOUNTER — Ambulatory Visit (INDEPENDENT_AMBULATORY_CARE_PROVIDER_SITE_OTHER): Payer: 59 | Admitting: Neurology

## 2014-09-30 VITALS — BP 100/70 | HR 102 | Ht 59.0 in | Wt 124.5 lb

## 2014-09-30 DIAGNOSIS — I951 Orthostatic hypotension: Secondary | ICD-10-CM | POA: Insufficient documentation

## 2014-09-30 DIAGNOSIS — R42 Dizziness and giddiness: Secondary | ICD-10-CM

## 2014-09-30 DIAGNOSIS — R51 Headache: Secondary | ICD-10-CM

## 2014-09-30 DIAGNOSIS — R519 Headache, unspecified: Secondary | ICD-10-CM

## 2014-09-30 NOTE — Patient Instructions (Signed)
I think the the dizziness may be related to the heart and you should probably have a workup from a cardiologist first.  We will refer you.

## 2014-09-30 NOTE — Progress Notes (Signed)
NEUROLOGY CONSULTATION NOTE  April Gibson MRN: [id number] DOB: 29-Apr-1976  Referring provider: Deliah Boston, PA-C Primary care provider: Deliah Boston, PA-C  Reason for consult:  Dizziness.  HISTORY OF PRESENT ILLNESS: April Gibson is a 39 year old right-handed female with chronic inactive hepatitis B who presents for dizziness and headache.  Records, labs and EKG reviewed.  She has experienced dizziness off and on for a couple of years.  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  She reports a sensation that she is going to pass out and feels that she will fall forward.  This is associated with palpitations.  She notes that her vision isn't clear, although it is not tunnel vision or blurred vision.  There is no associated spinning sensation, nausea, focal numbness or weakness.  It is not associated with headache.  It lasts for only a couple of seconds.  When she is bending forward and this occurs, it gets better when she looks is upright.  Sometimes it occurs when she is hungry.  She has never actually fallen or passed out.  She notes some ringing in her left ear.  It occurs a couple of times a month.  Workup included normal EKG and CMET.  On one occasion, her blood sugar at [facility] was mildly low (69).  TSH was 1.212 and Sed Rate was 28.    She also has occasional headaches, which she describes as a dull holocephalic non-throbbing headache.  Sometimes it is associated with nausea but denies visual disturbance, photophobia or phonophobia.  It usually responds to Advil.  It occurs infrequently, maybe 3 times a month at most.  PAST MEDICAL HISTORY: Past Medical History  Diagnosis Date  . 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 monitor labs yearly    PAST SURGICAL HISTORY: History reviewed. No pertinent past surgical history.  MEDICATIONS: Current Outpatient  Prescriptions on File Prior to Visit  Medication Sig Dispense Refill  . meclizine (ANTIVERT) 25 MG tablet Take 1 tablet (25 mg total) by mouth 2 (two) times daily as needed for dizziness. 30 tablet 0  . Multiple Vitamin (MULTIVITAMIN) tablet Take 1 tablet by mouth daily.     No current facility-administered medications on file prior to visit.    ALLERGIES: No Known Allergies  FAMILY HISTORY: History reviewed. No pertinent family history.  SOCIAL HISTORY: History   Social History  . Marital Status: Married    Spouse Name: N/A  . Number of Children: N/A  . Years of Education: N/A   Occupational History  . Not on file.   Social History Main Topics  . Smoking status: Never Smoker   . Smokeless tobacco: Never Used  . Alcohol Use: No  . Drug Use: No  . Sexual Activity: Not on file   Other Topics Concern  . Not on file   Social History Narrative   Lives with parents in a one story home.  Has 2 sons.  Works as a Advertising account planner.  Education: high school.    REVIEW OF SYSTEMS: Constitutional: No fevers, chills, or sweats, no generalized fatigue, change in appetite Eyes: No visual changes, double vision, eye pain Ear, nose and throat: No hearing loss, ear pain, nasal congestion, sore throat Cardiovascular: No chest pain, palpitations Respiratory:  No shortness of breath at rest or with exertion, wheezes GastrointestinaI: No nausea, vomiting, diarrhea, abdominal pain, fecal  incontinence Genitourinary:  No dysuria, urinary retention or frequency Musculoskeletal:  No neck pain, back pain Integumentary: No rash, pruritus, skin lesions Neurological: as above Psychiatric: No depression, insomnia, anxiety Endocrine: No palpitations, fatigue, diaphoresis, mood swings, change in appetite, change in weight, increased thirst Hematologic/Lymphatic:  No anemia, purpura, petechiae. Allergic/Immunologic: no itchy/runny eyes, nasal congestion, recent allergic reactions, rashes  PHYSICAL  EXAM: Filed Vitals:   09/30/14 1232  BP: 100/70  Pulse: 102  Orthostatic vital signs:   Blood pressure Pulse Supine:  98/70  94 bpm Sitting:  92/70  104 bpm Standing:  92/66  116 bpm General: No acute distress Head:  Normocephalic/atraumatic Eyes:  fundi unremarkable, without vessel changes, exudates, hemorrhages or papilledema. Neck: supple, no paraspinal tenderness, full range of motion Back: No paraspinal tenderness Heart: regular rate and rhythm Lungs: Clear to auscultation bilaterally. Vascular: No carotid bruits. Neurological Exam: Mental status: alert and oriented to person, place, and time, recent and remote memory intact, fund of knowledge intact, attention and concentration intact, speech fluent and not dysarthric, language intact. Cranial nerves: CN I: not tested CN II: pupils equal, round and reactive to light, visual fields intact, fundi unremarkable, without vessel changes, exudates, hemorrhages or papilledema. CN III, IV, VI:  full range of motion, no nystagmus, no ptosis CN V: facial sensation intact CN VII: upper and lower face symmetric CN VIII: hearing intact CN IX, X: gag intact, uvula midline CN XI: sternocleidomastoid and trapezius muscles intact CN XII: tongue midline Bulk & Tone: normal, no fasciculations. Motor:  5/5 throughout Sensation:  Temperature and vibration intact Deep Tendon Reflexes:  2+ throughout, toes downgoing Finger to nose testing:  No dysmetria Heel to shin:  On dysmetria Gait:  Normal station and stride.  Able to turn and tandem walk. Romberg negative.  IMPRESSION: 1.  Dizziness.  She describes lightheadedness and presyncope rather than vertigo.  She does have positive orthostatic vitals in regards to the increase in her pulse.  Although she does not have a drop in blood pressure, this finding fulfills diagnosis of orthostatic hypotension.  POTS may be in the differential.  She does endorse occasional headaches, but this  lightheadedness does not sound migrainous. 2.  Headaches are not specified.  They have both features of tension and migraine.    Headache may be a symptom of POTS as well.  PLAN: With this finding, in addition to her symptoms of palpitations, she should have a cardiac workup.  We will place referral.  Her symptoms are non-focal and her exam is non-focal.  I do not suspect an intracranial etiology that would warrant imaging of the brain.   Thank you for allowing me to take part in the care of this patient.  Shon MilletAdam Issis Lindseth, DO  CC:  Deliah BostonMichael Clark, PA-C

## 2014-10-02 ENCOUNTER — Ambulatory Visit (INDEPENDENT_AMBULATORY_CARE_PROVIDER_SITE_OTHER): Payer: 59

## 2014-10-02 ENCOUNTER — Ambulatory Visit (INDEPENDENT_AMBULATORY_CARE_PROVIDER_SITE_OTHER): Payer: 59 | Admitting: Family Medicine

## 2014-10-02 VITALS — BP 106/70 | HR 95 | Temp 99.8°F | Resp 18 | Ht 59.25 in | Wt 123.8 lb

## 2014-10-02 DIAGNOSIS — R42 Dizziness and giddiness: Secondary | ICD-10-CM | POA: Diagnosis not present

## 2014-10-02 NOTE — Progress Notes (Signed)
[facility] [address] [place] [address] [phone]  Date:  10/02/2014   Name:  April Gibson   DOB:  06-12-1975   MRN:  [id number]  PCP:  No PCP Per Patient    Chief Complaint: Dizziness   History of Present Illness:  April Gibson is a 39 y.o. very pleasant female patient who presents with the following:  She is here today with dizziness.  She has been seen for this issue several  times for this over the last couple of years.  She also saw neurology 2 dys ago for the same.   The neurologist felt that she could have POTS and placed a referral to cardiology.  She is here today because she is still dizzy and wonders what is wrong with her. 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  She wonders if she should have x-rays of her neck or "of my brain" to try and find the cause of her sx.   She notes that when she is hungry she feels nauseated   LMP 5/20 Here with her husband today Patient Active Problem List   Diagnosis Date Noted  . Dizziness and giddiness 09/30/2014  . Orthostatic hypotension 09/30/2014  . 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 monitor labs yearly    History reviewed. No pertinent past surgical history.  History  Substance Use Topics  . Smoking status: Never Smoker   . Smokeless tobacco: Never Used  . Alcohol Use: No    History reviewed. No pertinent family history.  No Known Allergies  Medication list has been reviewed and updated.  Current Outpatient Prescriptions on File Prior to Visit  Medication Sig Dispense Refill  . ciprofloxacin (CIPRO) 250 MG tablet     . meclizine (ANTIVERT)  25 MG tablet Take 1 tablet (25 mg total) by mouth 2 (two) times daily as needed for dizziness. (Patient not taking: Reported on 10/02/2014) 30 tablet 0  . Multiple Vitamin (MULTIVITAMIN) tablet Take 1 tablet by mouth daily.     No current facility-administered medications on file prior to visit.    Review of Systems:  As per HPI- otherwise negative.   Physical Examination: Filed Vitals:   10/02/14 1645  BP: 106/70  Pulse: 95  Temp: 99.8 F (37.7 C)  Resp: 18   Filed Vitals:   10/02/14 1645  Height: 4' 11.25" (1.505 m)  Weight: 123 lb 12.8 oz (56.155 kg)   Body mass index is 24.79 kg/(m^2). Ideal Body Weight: Weight in (lb) to have BMI = 25: 124.6  GEN: WDWN, NAD, Non-toxic, A & O x 3, looks well HEENT: Atraumatic, Normocephalic. Neck supple. No masses, No LAD.  Bilateral TM wnl, oropharynx normal.  PEERL,EOMI.   Ears and Nose: No external deformity. CV: RRR, No M/G/R. No JVD. No thrill. No extra heart sounds. PULM: CTA B, no wheezes, crackles, rhonchi. No retractions. No resp. distress. No accessory muscle use. ABD: S, NT, ND, +BS. No rebound. No HSM. EXTR: No c/c/e NEURO Normal gait. Normal strength and DTR all extremities, negative romberg PSYCH: Normally interactive.  Conversant. Not depressed or anxious appearing.  Calm demeanor.   [facility] reading (PRIMARY) by  Dr. Patsy Lageropland. Cervical spine: negative   Assessment and Plan: Dizziness of unknown cause - Plan: DG Cervical Spine Complete  Explained to her that I am sorry but I do not know why she is dizzy.  She has been seen several times for same.  She would like to have some sort of imaging of her brain.  She actually has a follow-up appt with neuro scheduled for 6/1- recommended that she keep this appt and discuss imaging with her neurologist.  She is also scheduled to see cardiology in July  Signed Abbe AmsterdamJessica Larin Weissberg, MD

## 2014-10-02 NOTE — Patient Instructions (Signed)
I am sorry that you have this trouble with dizziness.  I am not sure what further to do for you You have been referred to a cardiologist to see if there is any cardiac explanation for your dizziness.  Let us know if you have any change in your symptoms.

## 2014-10-08 ENCOUNTER — Encounter: Payer: Self-pay | Admitting: Diagnostic Neuroimaging

## 2014-10-08 ENCOUNTER — Ambulatory Visit (INDEPENDENT_AMBULATORY_CARE_PROVIDER_SITE_OTHER): Payer: 59 | Admitting: Diagnostic Neuroimaging

## 2014-10-08 VITALS — BP 113/72 | HR 112 | Ht 59.0 in | Wt 125.2 lb

## 2014-10-08 DIAGNOSIS — R51 Headache: Secondary | ICD-10-CM | POA: Diagnosis not present

## 2014-10-08 DIAGNOSIS — R200 Anesthesia of skin: Secondary | ICD-10-CM | POA: Diagnosis not present

## 2014-10-08 DIAGNOSIS — R110 Nausea: Secondary | ICD-10-CM

## 2014-10-08 DIAGNOSIS — R29818 Other symptoms and signs involving the nervous system: Secondary | ICD-10-CM | POA: Diagnosis not present

## 2014-10-08 DIAGNOSIS — H539 Unspecified visual disturbance: Secondary | ICD-10-CM | POA: Diagnosis not present

## 2014-10-08 DIAGNOSIS — R29898 Other symptoms and signs involving the musculoskeletal system: Secondary | ICD-10-CM

## 2014-10-08 DIAGNOSIS — R2689 Other abnormalities of gait and mobility: Secondary | ICD-10-CM

## 2014-10-08 DIAGNOSIS — R519 Headache, unspecified: Secondary | ICD-10-CM

## 2014-10-08 DIAGNOSIS — R202 Paresthesia of skin: Secondary | ICD-10-CM

## 2014-10-08 NOTE — Progress Notes (Signed)
[facility]  PATIENT: April Gibson DOB: 12/01/1975  REFERRING CLINICIAN: Copeland HISTORY FROM: patient  REASON FOR VISIT: new consult   HISTORICAL  CHIEF COMPLAINT:  Chief Complaint  Patient presents with  . New Evaluation    headache and dizziness for a while     HISTORY OF PRESENT ILLNESS:   39 year old right-handed female here for evaluation of headache, dizziness. Around 2013 patient had left arm weakness and tingling lasting for a few months and then resolve. 2014 patient had onset of intermittent "dizzy" spells where she felt a sensation like she would fall forward. This would typically triggered when she was at work, sitting, working on nails, with her head tilted forward. Patient would have 4-5 of these episodes per month, each lasting a few seconds at a time. Sometimes she has heart racing sensation, not feeling well, vision changes, nausea. Patient has had some similar symptoms while standing and walking, especially shopping and turning quickly. Patient has had some vision changes but not seen eye doctor yet.  Patient was recently evaluated by another neurologist (Dr. Everlena CooperJaffe) who noted orthostatic tachycardia and lightheadedness and set up referral to cardiology.   REVIEW OF SYSTEMS: Full 14 system review of systems performed and notable only for blurred vision itching dizziness.  ALLERGIES: No Known Allergies  HOME MEDICATIONS: Outpatient Prescriptions Prior to Visit  Medication Sig Dispense Refill  . meclizine (ANTIVERT) 25 MG tablet Take 1 tablet (25 mg total) by mouth 2 (two) times daily as needed for dizziness. 30 tablet 0  . Multiple Vitamin (MULTIVITAMIN) tablet Take 1 tablet by mouth daily.    . ciprofloxacin (CIPRO) 250 MG tablet      No facility-administered medications prior to visit.    PAST MEDICAL HISTORY: Past Medical History  Diagnosis Date  . 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 monitor labs yearly    PAST SURGICAL HISTORY: No past surgical history on file.  FAMILY HISTORY: Family History  Problem Relation Age of Onset  . Hepatitis C Father     SOCIAL HISTORY:  History   Social History  . Marital Status: Married    Spouse Name: Huntley DecSara  . Number of Children: 2  . Years of Education: 12   Occupational History  . Not on file.   Social History Main Topics  . Smoking status: Never Smoker   . Smokeless tobacco: Never Used  . Alcohol Use: No  . Drug Use: No  . Sexual Activity: Not on file   Other Topics Concern  . Not on file   Social History Narrative   Lives with parents in a one story home.     Has 2 sons.    Works as a Advertising account plannernail technician.     Education: high school.     PHYSICAL EXAM  Filed Vitals:   10/08/14 0845  BP: 111/77  Pulse: 103  Height: 4\' 11"  (1.499 m)  Weight: 125 lb 3.2 oz (56.79 kg)   Orthostatic VS for the past 24 hrs:  BP- Lying Pulse- Lying BP- Standing at 0 minutes Pulse- Standing at 0 minutes  10/08/14 1746 112/78 mmHg 109 113/72 mmHg 112    Body mass index is 25.27 kg/(m^2).  No exam data present  No flowsheet data found.  GENERAL EXAM: Patient is in no distress; well developed, nourished and groomed; neck is supple  CARDIOVASCULAR: Regular rate and rhythm, no murmurs, no carotid bruits  NEUROLOGIC: MENTAL STATUS:  awake, alert, oriented to person, place and time, recent and remote memory intact, normal attention and concentration, language fluent, comprehension intact, naming intact, fund of knowledge appropriate CRANIAL NERVE: no papilledema on fundoscopic exam, pupils equal and reactive to light, visual fields full to confrontation, extraocular muscles intact, no nystagmus, facial sensation and strength symmetric, hearing intact, palate elevates symmetrically, uvula midline, shoulder shrug symmetric, tongue midline. MOTOR: normal bulk and tone, full strength in the BUE,  BLE SENSORY: normal and symmetric to light touch, temperature, vibration  COORDINATION: finger-nose-finger, fine finger movements normal REFLEXES: deep tendon reflexes present and symmetric GAIT/STATION: narrow based gait; able to walk tandem; romberg is negative    DIAGNOSTIC DATA (LABS, IMAGING, TESTING) - I reviewed patient records, labs, notes, testing and imaging myself where available.  Lab Results  Component Value Date   WBC 12.2* 09/20/2014   HGB 13.2 09/20/2014   HCT 40.8 09/20/2014   MCV 92.0 09/20/2014   PLT 303 08/06/2014      Component Value Date/Time   NA 136 08/06/2014 1940   K 3.7 08/06/2014 1940   CL 103 08/06/2014 1940   CO2 26 08/06/2014 1940   GLUCOSE 77 08/06/2014 1940   BUN 10 08/06/2014 1940   CREATININE 0.67 08/06/2014 1940   CREATININE 0.49* 07/14/2014 1604   CALCIUM 9.5 08/06/2014 1940   PROT 8.1 08/06/2014 1940   ALBUMIN 4.5 08/06/2014 1940   AST 28 08/06/2014 1940   ALT 30 08/06/2014 1940   ALKPHOS 57 08/06/2014 1940   BILITOT 0.5 08/06/2014 1940   GFRNONAA >89 08/06/2014 1940   GFRNONAA >90 07/14/2014 1604   GFRAA >89 08/06/2014 1940   GFRAA >90 07/14/2014 1604   No results found for: CHOL, HDL, LDLCALC, LDLDIRECT, TRIG, CHOLHDL Lab Results  Component Value Date   HGBA1C 4.6 08/06/2014   No results found for: VITAMINB12 Lab Results  Component Value Date   TSH 1.212 09/20/2014    07/14/14 EKG - sinus tachycardia [I reviewed EKG myself and agree with interpretation. -VRP]   10/02/14 cervical spine xray - negative cervical spine radiographs. [I reviewed images myself and agree with interpretation. -VRP]     ASSESSMENT AND PLAN  39 y.o. year old female here with intermittent, positional, dizziness sensation, with sensation of falling forward, lightheadedness, nausea, heart racing, vision changes. Also with several month episode of left arm numbness and weakness in 2013. We'll check MRI brain to rule out demyelinating disease or other  CNS etiology. Will check carotid u/s and other lab testing. Cardiogenic etiologies are possible and I agree with cardiac consultation.  PLAN:  Orders Placed This Encounter  Procedures  . MR Brain W Wo Contrast  . US Carotid Bilateral  . Vitamin B12  . Hemoglobin A1c   Return in about 6 weeks (around 11/19/2014).    Suanne Marker, MD 10/08/2014, 9:24 AM Certified in Neurology, Neurophysiology and Neuroimaging  [facility] [address] [place], [place] [address] [phone]

## 2014-10-08 NOTE — Patient Instructions (Signed)
I will check additional testing (MRI, carotid / neck ultrasound, labs).  Please see eye doctor for eye exam.

## 2014-10-09 LAB — HEMOGLOBIN A1C
Est. average glucose Bld gHb Est-mCnc: 108 mg/dL
HEMOGLOBIN A1C: 5.4 % (ref 4.8–5.6)

## 2014-10-09 LAB — VITAMIN B12: VITAMIN B 12: 396 pg/mL (ref 211–946)

## 2014-10-10 ENCOUNTER — Telehealth: Payer: Self-pay | Admitting: *Deleted

## 2014-10-10 NOTE — Telephone Encounter (Signed)
-----   Message from Suanne MarkerVikram R Penumalli, MD sent at 10/09/2014  5:09 PM EDT ----- pls call with normal labs. -VRP

## 2014-10-10 NOTE — Telephone Encounter (Signed)
Called and spoke with the pt, informed her that her labs from 10/08/14 were normal. She thanked me and stated an understanding

## 2014-10-15 ENCOUNTER — Telehealth: Payer: Self-pay

## 2014-10-15 NOTE — Telephone Encounter (Signed)
Patient is returning a call to schedule an US.

## 2014-10-15 NOTE — Telephone Encounter (Signed)
LVM for patient to call office back to schedule US appointment. 

## 2014-10-15 NOTE — Telephone Encounter (Signed)
Called Patient, scheduled US Carotid.  Patient verbalized understanding. 

## 2014-11-12 ENCOUNTER — Ambulatory Visit (INDEPENDENT_AMBULATORY_CARE_PROVIDER_SITE_OTHER): Payer: 59

## 2014-11-12 DIAGNOSIS — R29898 Other symptoms and signs involving the musculoskeletal system: Secondary | ICD-10-CM

## 2014-11-12 DIAGNOSIS — R51 Headache: Secondary | ICD-10-CM

## 2014-11-12 DIAGNOSIS — H539 Unspecified visual disturbance: Secondary | ICD-10-CM

## 2014-11-12 DIAGNOSIS — R2689 Other abnormalities of gait and mobility: Secondary | ICD-10-CM

## 2014-11-12 DIAGNOSIS — R519 Headache, unspecified: Secondary | ICD-10-CM

## 2014-11-12 DIAGNOSIS — R200 Anesthesia of skin: Secondary | ICD-10-CM | POA: Diagnosis not present

## 2014-11-12 DIAGNOSIS — R110 Nausea: Secondary | ICD-10-CM

## 2014-11-12 DIAGNOSIS — R202 Paresthesia of skin: Secondary | ICD-10-CM

## 2014-11-19 ENCOUNTER — Ambulatory Visit (INDEPENDENT_AMBULATORY_CARE_PROVIDER_SITE_OTHER): Payer: 59 | Admitting: Diagnostic Neuroimaging

## 2014-11-19 VITALS — BP 112/79 | HR 110 | Ht 59.0 in | Wt 124.6 lb

## 2014-11-19 DIAGNOSIS — R42 Dizziness and giddiness: Secondary | ICD-10-CM | POA: Diagnosis not present

## 2014-11-19 NOTE — Progress Notes (Signed)
[facility]  PATIENT: April Gibson DOB: Oct 01, 1975  REFERRING CLINICIAN: Copeland HISTORY FROM: patient  REASON FOR VISIT: follow up   HISTORICAL  CHIEF COMPLAINT:  Chief Complaint  Patient presents with  . Balance    rm 7    HISTORY OF PRESENT ILLNESS:   UPDATE 11/19/14: Since last visit, doing a April better. Still mild sxs. Still with mild balance diff. Denies any stress, sleep or other problems. Does have neck tension and tightness.   PRIOR HPI (10/08/14) 39 year old right-handed female here for evaluation of headache, dizziness. Around 2013 patient had left arm weakness and tingling lasting for a few months and then resolve. 2014 patient had onset of intermittent "dizzy" spells where she felt a sensation like she would fall forward. This would typically triggered when she was at work, sitting, working on nails, with her head tilted forward. Patient would have 4-5 of these episodes per month, each lasting a few seconds at a time. Sometimes she has heart racing sensation, not feeling well, vision changes, nausea. Patient has had some similar symptoms while standing and walking, especially shopping and turning quickly. Patient has had some vision changes but not seen eye doctor yet. Patient was recently evaluated by another neurologist (Dr. Everlena Cooper) who noted orthostatic tachycardia and lightheadedness and set up referral to cardiology.   REVIEW OF SYSTEMS: Full 14 system review of systems performed and notable only for back pain.  ALLERGIES: No Known Allergies  HOME MEDICATIONS: No outpatient prescriptions prior to visit.   No facility-administered medications prior to visit.    PAST MEDICAL HISTORY: Past Medical History  Diagnosis Date  . 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 monitor labs yearly    PAST SURGICAL HISTORY: No past surgical history on file.  FAMILY HISTORY: Family  History  Problem Relation Age of Onset  . Hepatitis C Father     SOCIAL HISTORY:  History   Social History  . Marital Status: Married    Spouse Name: Huntley Dec  . Number of Children: 2  . Years of Education: 12   Occupational History  . Not on file.   Social History Main Topics  . Smoking status: Never Smoker   . Smokeless tobacco: Never Used  . Alcohol Use: No  . Drug Use: No  . Sexual Activity: Not on file   Other Topics Concern  . Not on file   Social History Narrative   Lives with parents in a one story home.     Has 2 sons.    Works as a Advertising account planner.     Education: high school.     PHYSICAL EXAM  Filed Vitals:   11/19/14 1055  BP: 112/79  Pulse: 110  Height:  (1.499 m)  Weight: 124 lb 9.6 oz (56.518 kg)   No data found.   Body mass index is 25.15 kg/(m^2).  No exam data present  No flowsheet data found.  GENERAL EXAM: Patient is in no distress; well developed, nourished and groomed; neck is supple  CARDIOVASCULAR: Regular rate and rhythm, no murmurs, no carotid bruits  NEUROLOGIC: MENTAL STATUS: awake, alert, oriented to person, place and time, recent and remote memory intact, normal attention and concentration, language fluent, comprehension intact, naming intact, fund of knowledge appropriate CRANIAL NERVE: pupils equal and reactive to light, visual fields full to confrontation, extraocular muscles intact, no nystagmus, facial sensation and strength symmetric, hearing intact, palate elevates symmetrically,  uvula midline, shoulder shrug symmetric, tongue midline. MOTOR: normal bulk and tone, full strength in the BUE, BLE SENSORY: normal and symmetric to light touch, temperature, vibration  COORDINATION: finger-nose-finger, fine finger movements normal REFLEXES: deep tendon reflexes present and symmetric GAIT/STATION: narrow based gait; able to walk tandem; romberg is negative    DIAGNOSTIC DATA (LABS, IMAGING, TESTING) - I reviewed  patient records, labs, notes, testing and imaging myself where available.  Lab Results  Component Value Date   WBC 12.2* 09/20/2014   HGB 13.2 09/20/2014   HCT 40.8 09/20/2014   MCV 92.0 09/20/2014   PLT 303 08/06/2014      Component Value Date/Time   NA 136 08/06/2014 1940   K 3.7 08/06/2014 1940   CL 103 08/06/2014 1940   CO2 26 08/06/2014 1940   GLUCOSE 77 08/06/2014 1940   BUN 10 08/06/2014 1940   CREATININE 0.67 08/06/2014 1940   CREATININE 0.49* 07/14/2014 1604   CALCIUM 9.5 08/06/2014 1940   PROT 8.1 08/06/2014 1940   ALBUMIN 4.5 08/06/2014 1940   AST 28 08/06/2014 1940   ALT 30 08/06/2014 1940   ALKPHOS 57 08/06/2014 1940   BILITOT 0.5 08/06/2014 1940   GFRNONAA >89 08/06/2014 1940   GFRNONAA >90 07/14/2014 1604   GFRAA >89 08/06/2014 1940   GFRAA >90 07/14/2014 1604   No results found for: CHOL, HDL, LDLCALC, LDLDIRECT, TRIG, CHOLHDL Lab Results  Component Value Date   HGBA1C 5.4 10/08/2014   Lab Results  Component Value Date   VITAMINB12 396 10/08/2014   Lab Results  Component Value Date   TSH 1.212 09/20/2014    07/14/14 EKG - sinus tachycardia [I reviewed EKG myself and agree with interpretation. -VRP]   10/02/14 cervical spine xray - negative cervical spine radiographs. [I reviewed images myself and agree with interpretation. -VRP]   11/19/14 carotid u/s - normal    ASSESSMENT AND PLAN  39 y.o. year old female here with intermittent, positional, dizziness sensation, with sensation of falling forward, lightheadedness, nausea, heart racing, vision changes. Also with several month episode of left arm numbness and weakness in 2013. MRI was ordered but patient held off on scheduling (? Financial reasons). Cardiogenic etiologies are possible and I agree with cardiac consultation.  PLAN: I spent 15 minutes of face to face time with patient. Greater than 50% of time was spent in counseling and coordination of care with patient. In summary we discussed:  -  follow up MRI brain (patient needs to check with insurance) - follow up with cardiology consult (next week)  Return in about 3 months (around 02/19/2015).    Suanne MarkerVIKRAM R. Tyrihanna Wingert, MD 11/19/2014, 11:13 AM Certified in Neurology, Neurophysiology and Neuroimaging  [facility] [address], [address] [place], [place] [id number] [phone]

## 2014-11-19 NOTE — Patient Instructions (Signed)
Check MRI brain scan.   Follow up with heart doctor.

## 2014-11-20 NOTE — Progress Notes (Signed)
     HPI: 39 yo female for evaluation of dizziness. Carotid dopplers 7/16 normal. TSH 5/16 normal. Seen by neurology and felt to possibly have POTS. Patient does not have dyspnea on exertion, orthopnea, PND, pedal edema or exertional chest pain. For the past year she has had spells of falling forward or to the side associated with dizziness. There is mild palpitations associated with these. They last seconds and resolve. She has episodes both sitting and standing. She does not have orthostatic symptoms. She also has times of nausea immediately after eating or if she hasn't eaten for quite some time.  No current outpatient prescriptions on file.   No current facility-administered medications for this visit.    No Known Allergies   Past Medical History  Diagnosis Date  . 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 monitor labs yearly    Past Surgical History  Procedure Laterality Date  . No prior surgery      History   Social History  . Marital Status: Married    Spouse Name: Huntley DecSara  . Number of Children: 2  . Years of Education: 12   Occupational History  . Not on file.   Social History Main Topics  . Smoking status: Never Smoker   . Smokeless tobacco: Never Used  . Alcohol Use: No  . Drug Use: No  . Sexual Activity: Not on file   Other Topics Concern  . Not on file   Social History Narrative   Lives with parents in a one story home.     Has 2 sons.    Works as a Advertising account plannernail technician.     Education: high school.    Family History  Problem Relation Age of Onset  . Hepatitis C Father     ROS: no fevers or chills, productive cough, hemoptysis, dysphasia, odynophagia, melena, hematochezia, dysuria, hematuria, rash, seizure activity, orthopnea, PND, pedal edema, claudication. Remaining systems are negative.  Physical Exam:   Blood pressure 109/76, pulse 108, height 4\' 11"  (1.499 m), weight 125 lb 1.6 oz  (56.745 kg).  General:  Well developed/well nourished in NAD Skin warm/dry Patient not depressed No peripheral clubbing Back-normal HEENT-normal/normal eyelids Neck supple/normal carotid upstroke bilaterally; no bruits; no JVD; no thyromegaly chest - CTA/ normal expansion CV - RRR/normal S1 and S2; no murmurs, rubs or gallops;  PMI nondisplaced Abdomen -NT/ND, no HSM, no mass, + bowel sounds, no bruit 2+ femoral pulses, no bruits Ext-no edema, chords, 2+ DP Neuro-grossly nonfocal  ECG NSR, no ST changes

## 2014-11-24 ENCOUNTER — Ambulatory Visit (INDEPENDENT_AMBULATORY_CARE_PROVIDER_SITE_OTHER): Payer: 59 | Admitting: Cardiology

## 2014-11-24 ENCOUNTER — Encounter: Payer: Self-pay | Admitting: Cardiology

## 2014-11-24 ENCOUNTER — Ambulatory Visit: Payer: 59 | Admitting: Cardiology

## 2014-11-24 ENCOUNTER — Encounter: Payer: Self-pay | Admitting: *Deleted

## 2014-11-24 VITALS — BP 109/76 | HR 108 | Ht 59.0 in | Wt 125.1 lb

## 2014-11-24 DIAGNOSIS — R42 Dizziness and giddiness: Secondary | ICD-10-CM | POA: Diagnosis not present

## 2014-11-24 DIAGNOSIS — R002 Palpitations: Secondary | ICD-10-CM | POA: Diagnosis not present

## 2014-11-24 NOTE — Assessment & Plan Note (Signed)
Scheduled CardioNet to further assess. Echocardiogram to assess LV function.

## 2014-11-24 NOTE — Patient Instructions (Signed)
Your physician wants you to follow-up in: 3 MONTHS WITH DR Jens SomRENSHAW You will receive a reminder letter in the mail two months in advance. If you don't receive a letter, please call our office to schedule the follow-up appointment.   Your physician has requested that you have an echocardiogram. Echocardiography is a painless test that uses sound waves to create images of your heart. It provides your doctor with information about the size and shape of your heart and how well your heart's chambers and valves are working. This procedure takes approximately one hour. There are no restrictions for this procedure.   Your physician has recommended that you wear an event monitor. Event monitors are medical devices that record the heart's electrical activity. Doctors most often us these monitors to diagnose arrhythmias. Arrhythmias are problems with the speed or rhythm of the heartbeat. The monitor is a small, portable device. You can wear one while you do your normal daily activities. This is usually used to diagnose what is causing palpitations/syncope (passing out).   PLEASE SCHEDULE TESTING AT THE [address] LOCATION

## 2014-11-24 NOTE — Assessment & Plan Note (Signed)
Etiology of symptoms unclear. Vital signs were checked in the office today. In the lying position her blood pressure was 118/77 with pulse of 97. After standing 10 minutes her blood pressure was 116/79 with a pulse of 104. Therefore this does not appear to be POTS. She does have palpitations associated with her spells at times and I will schedule a CardioNet to further assess. I will arrange an echocardiogram to assess LV function. Should her symptoms are not consistently orthostatic mediated and she is not orthostatic in the office today.

## 2014-12-03 ENCOUNTER — Ambulatory Visit (INDEPENDENT_AMBULATORY_CARE_PROVIDER_SITE_OTHER): Payer: 59

## 2014-12-03 DIAGNOSIS — R51 Headache: Secondary | ICD-10-CM

## 2014-12-03 DIAGNOSIS — R2689 Other abnormalities of gait and mobility: Secondary | ICD-10-CM

## 2014-12-03 DIAGNOSIS — R29818 Other symptoms and signs involving the nervous system: Secondary | ICD-10-CM | POA: Diagnosis not present

## 2014-12-03 DIAGNOSIS — R200 Anesthesia of skin: Secondary | ICD-10-CM | POA: Diagnosis not present

## 2014-12-03 DIAGNOSIS — R29898 Other symptoms and signs involving the musculoskeletal system: Secondary | ICD-10-CM

## 2014-12-03 DIAGNOSIS — R110 Nausea: Secondary | ICD-10-CM | POA: Diagnosis not present

## 2014-12-03 DIAGNOSIS — H539 Unspecified visual disturbance: Secondary | ICD-10-CM

## 2014-12-03 DIAGNOSIS — R519 Headache, unspecified: Secondary | ICD-10-CM

## 2014-12-03 DIAGNOSIS — R202 Paresthesia of skin: Secondary | ICD-10-CM

## 2014-12-04 MED ORDER — GADOPENTETATE DIMEGLUMINE 469.01 MG/ML IV SOLN: 11.0000 mL | Freq: Once | INTRAVENOUS | Status: AC | PRN

## 2014-12-07 ENCOUNTER — Ambulatory Visit (INDEPENDENT_AMBULATORY_CARE_PROVIDER_SITE_OTHER): Payer: 59 | Admitting: Family Medicine

## 2014-12-07 VITALS — BP 108/80 | HR 90 | Temp 98.8°F | Resp 18 | Ht 59.0 in | Wt 126.6 lb

## 2014-12-07 DIAGNOSIS — R42 Dizziness and giddiness: Secondary | ICD-10-CM

## 2014-12-07 DIAGNOSIS — R110 Nausea: Secondary | ICD-10-CM | POA: Diagnosis not present

## 2014-12-07 DIAGNOSIS — E162 Hypoglycemia, unspecified: Secondary | ICD-10-CM | POA: Diagnosis not present

## 2014-12-07 DIAGNOSIS — D72829 Elevated white blood cell count, unspecified: Secondary | ICD-10-CM

## 2014-12-07 LAB — POCT CBC
Granulocyte percent: 61.5 %G (ref 37–80)
HCT, POC: 41.1 % (ref 37.7–47.9)
Hemoglobin: 13.4 g/dL (ref 12.2–16.2)
Lymph, poc: 2.7 (ref 0.6–3.4)
MCH, POC: 29.7 pg (ref 27–31.2)
MCHC: 32.7 g/dL (ref 31.8–35.4)
MCV: 91 fL (ref 80–97)
MID (cbc): 0.7 (ref 0–0.9)
MPV: 7.4 fL (ref 0–99.8)
POC Granulocyte: 5.5 (ref 2–6.9)
POC LYMPH PERCENT: 30.5 % (ref 10–50)
POC MID %: 8 % (ref 0–12)
Platelet Count, POC: 284 10*3/uL (ref 142–424)
RBC: 4.52 M/uL (ref 4.04–5.48)
RDW, POC: 12.6 %
WBC: 9 10*3/uL (ref 4.6–10.2)

## 2014-12-07 LAB — GLUCOSE, POCT (MANUAL RESULT ENTRY): POC Glucose: 121 mg/dL — AB (ref 70–99)

## 2014-12-07 MED ORDER — BLOOD GLUCOSE MONITOR KIT: PACK | Status: DC

## 2014-12-07 NOTE — Progress Notes (Signed)
Chief Complaint:  Chief Complaint  Patient presents with  . Nausea    x 1 week  . dizzness    x 1 week    HPI: April Gibson is a 39 y.o. female who is here for intermittent episodes of dizziness and now associated with nausea. This has been a chronic problem, the nausea is slightly more new and worse,  She took a pregnancy test today and it was negative.  She has been to neurology and a brain MRI was done and was normal. She has also been referred to cardiology for possible POTS and has a follow-up appt for further cardiac testing soon with CardioNEt and echocardiogram. She states the dizziness and nausea goes away when she eats.  She has not been eating a high protein diet. She is still eating refined sugar carbs, she has to eat every 2 hours to feel not dizzy. She also has some lightheadnessness when she moves her head in different directions , specifically in extension. Does not matter necessarily if sitting or standing June 20 was her LMP, again she is not pregnant, she took a home pregnancy test today , past her period 11 days  She has nausea and dizziness, but not similar to this so this is why she is here. She denies depression.  Feels like she has no energy, she feesl her dizzines has gotten worse since she had her MRI, she feels more dizzy and nauseated expecially if she is hungry.   Wt Readings from Last 3 Encounters:  12/07/14 126 lb 9.6 oz (57.425 kg)  12/02/14 125 lb (56.7 kg)  11/24/14 125 lb 1.6 oz (56.745 kg)   BP Readings from Last 3 Encounters:  12/07/14 108/80  11/24/14 109/76  11/19/14 112/79   Lab Results  Component Value Date   HGBA1C 5.4 10/08/2014   HGBA1C 4.6 08/06/2014   Lab Results  Component Value Date   CREATININE 0.48* 12/07/2014      Past Medical History  Diagnosis Date  . 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 monitor labs yearly   Past Surgical History    Procedure Laterality Date  . No prior surgery     History   Social History  . Marital Status: Married    Spouse Name: Huntley Dec  . Number of Children: 2  . Years of Education: 12   Social History Main Topics  . Smoking status: Never Smoker   . Smokeless tobacco: Never Used  . Alcohol Use: No  . Drug Use: No  . Sexual Activity: Not on file   Other Topics Concern  . None   Social History Narrative   Lives with parents in a one story home.     Has 2 sons.    Works as a Advertising account planner.     Education: high school.   Family History  Problem Relation Age of Onset  . Hepatitis C Father    No Known Allergies Prior to Admission medications   Not on File     ROS: The patient denies fevers, chills, night sweats, unintentional weight loss, chest pain, palpitations, wheezing, dyspnea on exertion,  vomiting, abdominal pain, dysuria, hematuria, melena, numbness,  or tingling.   All other systems have been reviewed and were otherwise negative with the exception of those mentioned in the HPI and as above.    PHYSICAL EXAM: Filed Vitals:   12/07/14 1406  BP: 108/80  Pulse: 90  Temp: 98.8 F (37.1 C)  Resp: 18   Body mass index is 25.56 kg/(m^2).   General: Alert, no acute distress HEENT:  Normocephalic, atraumatic, oropharynx patent. EOMI, PERRLA Cardiovascular:  Regular rate and rhythm, no rubs murmurs or gallops.  No Carotid bruits, radial pulse intact. No pedal edema.  Respiratory: Clear to auscultation bilaterally.  No wheezes, rales, or rhonchi.  No cyanosis, no use of accessory musculature Abdominal: No organomegaly, abdomen is soft and non-tender, positive bowel sounds. No masses. Skin: No rashes. Neurologic: Facial musculature symmetric. Psychiatric: Patient acts appropriately throughout our interaction. Lymphatic: No cervical or submandibular lymphadenopathy Musculoskeletal: Gait intact. No edema, tenderness   LABS: Results for orders placed or performed in visit  on 12/07/14  POCT CBC  Result Value Ref Range   WBC 9.0 4.6 - 10.2 K/uL   Lymph, poc 2.7 0.6 - 3.4   POC LYMPH PERCENT 30.5 10 - 50 %L   MID (cbc) 0.7 0 - 0.9   POC MID % 8.0 0 - 12 %M   POC Granulocyte 5.5 2 - 6.9   Granulocyte percent 61.5 37 - 80 %G   RBC 4.52 4.04 - 5.48 M/uL   Hemoglobin 13.4 12.2 - 16.2 g/dL   HCT, POC 02.7 25.3 - 47.9 %   MCV 91.0 80 - 97 fL   MCH, POC 29.7 27 - 31.2 pg   MCHC 32.7 31.8 - 35.4 g/dL   RDW, POC 66.4 %   Platelet Count, POC 284 142 - 424 K/uL   MPV 7.4 0 - 99.8 fL  POCT glucose (manual entry)  Result Value Ref Range   POC Glucose 121 (A) 70 - 99 mg/dl     EKG/XRAY:   Primary read interpreted by Dr[facility] Conley Rolls at [facility].   ASSESSMENT/PLAN: Encounter Diagnoses  Name Primary?  . Dizziness of unknown cause Yes  . Nausea without vomiting   . Leukocytosis   . Hypoglycemia      April Gibson is a pleasant 39 yo [name] who is here for intermittent episodes dizziness and now associated with nausea. This has been a chronic problem, the nausea is slightly more new,   She took a pregnancy test today and it was negative. She has been evaluated by neurology and a brain MRI was done and was normal. She has also been referred to cardiology for possible POTS and has a follow-up appt for further cardiac testing soon. I am not sure what is happening but will advise her to take her blood sugar when she feels dizzy, nauseated and see if correlated with hypoglycemia, refer to endocrinology if cardiac workup is negative.  Advise to eat high protein diet, push fluids, she may or may not do BPPV exercises to help  Fu with cardiology, if cardiology workup negative and glucose logs are abnormal then refer to Endocrinology Prior Leukocytosis resolved Blood sugar was normal but she is currently asymptomatic , ate 2 hours prior Gross sideeffects, risk and benefits, and alternatives of medications d/w patient. Patient is aware that all medications have potential  sideeffects and we are unable to predict every sideeffect or drug-drug interaction that may occur.  Brownie Gockel DO  12/07/2014 3:11 PM

## 2014-12-07 NOTE — Patient Instructions (Signed)
Benign Positional Vertigo Vertigo means you feel like you or your surroundings are moving when they are not. Benign positional vertigo is the most common form of vertigo. Benign means that the cause of your condition is not serious. Benign positional vertigo is more common in older adults. CAUSES  Benign positional vertigo is the result of an upset in the labyrinth system. This is an area in the middle ear that helps control your balance. This may be caused by a viral infection, head injury, or repetitive motion. However, often no specific cause is found. SYMPTOMS  Symptoms of benign positional vertigo occur when you move your head or eyes in different directions. Some of the symptoms may include:  Loss of balance and falls.  Vomiting.  Blurred vision.  Dizziness.  Nausea.  Involuntary eye movements (nystagmus). DIAGNOSIS  Benign positional vertigo is usually diagnosed by physical exam. If the specific cause of your benign positional vertigo is unknown, your caregiver may perform imaging tests, such as magnetic resonance imaging (MRI) or computed tomography (CT). TREATMENT  Your caregiver may recommend movements or procedures to correct the benign positional vertigo. Medicines such as meclizine, benzodiazepines, and medicines for nausea may be used to treat your symptoms. In rare cases, if your symptoms are caused by certain conditions that affect the inner ear, you may need surgery. HOME CARE INSTRUCTIONS   Follow your caregiver's instructions.  Move slowly. Do not make sudden body or head movements.  Avoid driving.  Avoid operating heavy machinery.  Avoid performing any tasks that would be dangerous to you or others during a vertigo episode.  Drink enough fluids to keep your urine clear or pale yellow. SEEK IMMEDIATE MEDICAL CARE IF:   You develop problems with walking, weakness, numbness, or using your arms, hands, or legs.  You have difficulty speaking.  You develop  severe headaches.  Your nausea or vomiting continues or gets worse.  You develop visual changes.  Your family or friends notice any behavioral changes.  Your condition gets worse.  You have a fever.  You develop a stiff neck or sensitivity to light. MAKE SURE YOU:   Understand these instructions.  Will watch your condition.  Will get help right away if you are not doing well or get worse. Document Released: 01/31/2006 Document Revised: 07/18/2011 Document Reviewed: 01/13/2011 ExitCare Patient Information 2015 [employer]. This information is not intended to replace advice given to you by your health care provider. Make sure you discuss any questions you have with your health care provider.    

## 2014-12-08 LAB — COMPLETE METABOLIC PANEL WITH GFR
ALT: 29 U/L (ref 6–29)
AST: 27 U/L (ref 10–30)
Alkaline Phosphatase: 58 U/L (ref 33–115)
CO2: 28 mmol/L (ref 20–31)
Calcium: 9.6 mg/dL (ref 8.6–10.2)
Chloride: 104 mmol/L (ref 98–110)
GFR, Est African American: >89 mL/min (ref >=60)
GFR, Est Non African American: >89 mL/min (ref >=60)
Glucose, Bld: 109 mg/dL — ABNORMAL HIGH (ref 65–99)
Potassium: 4 mmol/L (ref 3.5–5.3)
Sodium: 140 mmol/L (ref 135–146)
Total Bilirubin: 0.3 mg/dL (ref 0.2–1.2)
Total Protein: 7.8 g/dL (ref 6.1–8.1)

## 2014-12-08 LAB — COMPLETE METABOLIC PANEL WITHOUT GFR
Albumin: 4.3 g/dL (ref 3.6–5.1)
BUN: 8 mg/dL (ref 7–25)
Creat: 0.48 mg/dL — ABNORMAL LOW (ref 0.50–1.10)

## 2014-12-09 ENCOUNTER — Telehealth: Payer: Self-pay

## 2014-12-09 ENCOUNTER — Other Ambulatory Visit: Payer: Self-pay | Admitting: Cardiology

## 2014-12-09 MED ORDER — BLOOD GLUCOSE MONITOR KIT: PACK | Status: DC

## 2014-12-09 MED ORDER — LANCETS MISC: Status: DC

## 2014-12-09 MED ORDER — GLUCOSE BLOOD VI STRP: ORAL_STRIP | Status: DC

## 2014-12-09 NOTE — Telephone Encounter (Signed)
I would like her to measure up to TID prn , she does not have it daily. Again prn for hypoglycemic symtpoms, ie dizziness, nausea. Can we give her 100 lancets and 100 strips. Thanks

## 2014-12-09 NOTE — Telephone Encounter (Signed)
Pharm faxed req for new Rx for meter (and strips/lancets) with quantities of strips and specific instr's as to how many times per day to test. Dr Conley Rolls, is this just an prn testing when pt has Sxs of hypoglycemia, or can we say once daily, or other? Please advise and I will be happy to write out the Rxs and send in.

## 2014-12-10 ENCOUNTER — Encounter: Payer: Self-pay | Admitting: Cardiology

## 2014-12-10 ENCOUNTER — Ambulatory Visit (INDEPENDENT_AMBULATORY_CARE_PROVIDER_SITE_OTHER): Payer: 59

## 2014-12-10 ENCOUNTER — Ambulatory Visit (HOSPITAL_COMMUNITY): Payer: 59 | Attending: Cardiology

## 2014-12-10 ENCOUNTER — Other Ambulatory Visit: Payer: Self-pay

## 2014-12-10 DIAGNOSIS — I071 Rheumatic tricuspid insufficiency: Secondary | ICD-10-CM | POA: Insufficient documentation

## 2014-12-10 DIAGNOSIS — I371 Nonrheumatic pulmonary valve insufficiency: Secondary | ICD-10-CM | POA: Diagnosis not present

## 2014-12-10 DIAGNOSIS — R002 Palpitations: Secondary | ICD-10-CM

## 2014-12-10 DIAGNOSIS — I351 Nonrheumatic aortic (valve) insufficiency: Secondary | ICD-10-CM | POA: Insufficient documentation

## 2014-12-10 DIAGNOSIS — I340 Nonrheumatic mitral (valve) insufficiency: Secondary | ICD-10-CM | POA: Insufficient documentation

## 2014-12-11 ENCOUNTER — Encounter: Payer: Self-pay | Admitting: Family Medicine

## 2015-01-08 ENCOUNTER — Ambulatory Visit (INDEPENDENT_AMBULATORY_CARE_PROVIDER_SITE_OTHER): Payer: 59 | Admitting: Family Medicine

## 2015-01-08 VITALS — BP 110/72 | HR 99 | Temp 98.8°F | Resp 18 | Ht 59.0 in | Wt 128.2 lb

## 2015-01-08 DIAGNOSIS — R42 Dizziness and giddiness: Secondary | ICD-10-CM | POA: Diagnosis not present

## 2015-01-08 LAB — POCT URINE PREGNANCY: PREG TEST UR: NEGATIVE

## 2015-01-08 NOTE — Patient Instructions (Signed)
We do not have a reason for your dizziness.  It may be time to try and manage your symptoms instead of trying to find a cause It seems unlikely that more tests will show [place] an exact cause for your dizziness I would suggest that you eat frequent small meals and rest when you feel dizzy and tired Let us know if there is anything else we can do to help

## 2015-01-08 NOTE — Progress Notes (Signed)
[facility] [address], [place] [address] [phone]  Date:  01/08/2015   Name:  April Gibson   DOB:  1975-12-07   MRN:  [id number]  PCP:  Leotis Pain, DO    Chief Complaint: Dizziness and Headache   History of Present Illness:  April Gibson is a 39 y.o. very pleasant female patient who presents with the following:  8 visits to [facility] for dizziness over the last 2 years.  She has seen neurolgoy (spring of this year)- thry thought that she might have POTS, referred to cardiology. She saw cardiology last month; not thought ot have POTS, not orthostatic She had an echo which was normal, cardiac event monitor Most recently seen on 7/31 with dizziness again.  Her work- up was benign.    She is here today because she felt dizzy again, "like I might pass out."  However she did not pass out; has not passed out during the few years she has struggled with this dizziness This is the same type of dizziness that she hs had for years now.  She may notice this every couple of days Nothing was different today as far as her dizziness. However she also notes that when she is hungry she feels nauseated.    She has been checking her blood sugar since she got her machine just a few days ago.  She has checked her sugar 6 times, all between (before eating in the morning) 79 and 143 (post prandial)  She also had an MRI of her head a month ago- normal   LMP about a month ago Patient Active Problem List   Diagnosis Date Noted  . Palpitations 11/24/2014  . Dizziness and giddiness 09/30/2014  . Orthostatic hypotension 09/30/2014  . 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 monitor labs yearly    Past Surgical History  Procedure Laterality Date  . No prior surgery      Social History   Substance Use Topics  . Smoking status: Never Smoker   . Smokeless tobacco: Never Used  . Alcohol Use: No    Family History  Problem Relation Age of Onset  . Hepatitis C Father     No Known Allergies  Medication list has been reviewed and updated.  Current Outpatient Prescriptions on File Prior to Visit  Medication Sig Dispense Refill  . blood glucose meter kit and supplies KIT Test blood sugar up to 3 times daily as needed for hypoglycemic symptoms, ie: dizziness, nausea. 1 each 0  . glucose blood test strip Test blood sugar up to 3 times daily as needed for hypoglycemic symptoms, ie: dizziness, nausea. 100 each 11  . Lancets MISC Test blood sugar up to 3 times daily as needed for hypoglycemic symptoms, ie: dizziness, nausea. 100 each 11   No current facility-administered medications on file prior to visit.    Review of Systems:  As per HPI- otherwise negative.   Physical Examination: Filed Vitals:   01/08/15 1145  BP: 110/72  Pulse: 99  Temp: 98.8 F (37.1 C)  Resp: 18   Filed Vitals:   01/08/15 1145  Height: '4\' 11"'  (1.499 m)  Weight: 128 lb 3.2 oz (58.151 kg)   Body mass index is 25.88 kg/(m^2). Ideal Body Weight: Weight in (lb) to  have BMI = 25: 123.5  GEN: WDWN, NAD, Non-toxic, A & O x 3, looks well HEENT: Atraumatic, Normocephalic. Neck supple. No masses, No LAD.  Bilateral TM wnl, oropharynx normal.  PEERL,EOMI.   Ears and Nose: No external deformity. CV: RRR, No M/G/R. No JVD. No thrill. No extra heart sounds. PULM: CTA B, no wheezes, crackles, rhonchi. No retractions. No resp. distress. No accessory muscle use. ABD: S, NT, ND EXTR: No c/c/e NEURO Normal gait.  PSYCH: Normally interactive. Conversant. Not depressed or anxious appearing.  Calm demeanor.   Results for orders placed or performed in visit on 01/08/15  POCT urine pregnancy  Result Value Ref Range   Preg Test, Ur Negative Negative   Wt Readings from Last 3 Encounters:  01/08/15 128 lb  3.2 oz (58.151 kg)  12/07/14 126 lb 9.6 oz (57.425 kg)  12/02/14 125 lb (56.7 kg)    Assessment and Plan: Dizziness and giddiness - Plan: POCT urine pregnancy  Here today for at least her 9th visit for this concern over the last 2 years.  She had had extensive evaluation by neurology (negative brain MRI a month ago) and cardiology (currently wearing a monitor) Counseled her that we cannot so far find a reason for her sx.  She is certainly free to continue to be seen for this, but she may just continue to undergo more testing without a definite diagnosis.  It may be time to try and manage her sx by resting when she feels weak and eating small amounts frequently to maintain her blood sugar.  She and her husband state understanding  Signed Lamar Blinks, MD

## 2015-01-29 ENCOUNTER — Telehealth: Payer: Self-pay | Admitting: *Deleted

## 2015-01-29 NOTE — Telephone Encounter (Signed)
Monitor reviewed by dr Jens Som shows sinus to sinus tach.  pt aware of results

## 2015-02-25 ENCOUNTER — Ambulatory Visit: Payer: 59 | Admitting: Diagnostic Neuroimaging

## 2015-04-05 ENCOUNTER — Ambulatory Visit (INDEPENDENT_AMBULATORY_CARE_PROVIDER_SITE_OTHER): Payer: 59 | Admitting: Family Medicine

## 2015-04-05 ENCOUNTER — Ambulatory Visit (INDEPENDENT_AMBULATORY_CARE_PROVIDER_SITE_OTHER): Payer: 59

## 2015-04-05 VITALS — BP 108/60 | HR 90 | Temp 99.0°F | Resp 14 | Ht 59.0 in | Wt 133.0 lb

## 2015-04-05 DIAGNOSIS — R0789 Other chest pain: Secondary | ICD-10-CM | POA: Diagnosis not present

## 2015-04-05 DIAGNOSIS — R059 Cough, unspecified: Secondary | ICD-10-CM

## 2015-04-05 DIAGNOSIS — R05 Cough: Secondary | ICD-10-CM

## 2015-04-05 DIAGNOSIS — E162 Hypoglycemia, unspecified: Secondary | ICD-10-CM

## 2015-04-05 MED ORDER — BENZONATATE 100 MG PO CAPS: 200.0000 mg | ORAL_CAPSULE | Freq: Two times a day (BID) | ORAL | Status: DC | PRN

## 2015-04-05 NOTE — Patient Instructions (Signed)

## 2015-04-05 NOTE — Progress Notes (Signed)
Chief Complaint:  Chief Complaint  Patient presents with  . Cough    x 2 weeks   . back and chest pain from coughing    HPI: April Gibson is a 39 y.o. female who reports to [facility] today complaining of cough x 2 weeks. Viral sxs, improving but is worried about chest pain. No to low risk for any cardiac issues.  +  Chest pain radiates to back when she coughs. No wheezing,  no SOB  She has some neck pain from coughing.  No fevers or chills, no sinus issues She has no facial pain or ear pain She has no asthma or allergies.  No diarrhea.  She has been to see [place] multiple times for feeling faint and dizzy and we have sent her to neurology and also to cardiology, workups have been negative She states that she feels bad when she has low sugars, her sugars have been in the high 70s-80s and sometime higher, she is not diabetic. She has been eating more smaller meals and this iscausing her to gain weight but she feels somewhat better, She has been eating and drinking normal, no endocrine disorder that she knows of. 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 But still has these dizzy spells.  She denies any chemical allergies. Denies stress or depression.   Please see Dr Cyndie Chime notes from prior OV   8 visits to [facility] for dizziness over the last 2 years. She has seen neurolgoy (spring of this year)- thry thought that she might have POTS, referred to cardiology. She saw cardiology last month; not thought ot have POTS, not orthostatic She had an echo which was normal, cardiac event monitor Most recently seen on 7/31 with dizziness again. Her work- up was benign.    Dizziness and giddiness - Plan: POCT urine pregnancy  Here today for at least her 9th visit for this concern over the last 2 years. She had had extensive evaluation by neurology (negative brain MRI a month ago) and cardiology (currently  wearing a monitor) Counseled her that we cannot so far find a reason for her sx. She is certainly free to continue to be seen for this, but she may just continue to undergo more testing without a definite diagnosis. It may be time to try and manage her sx by resting when she feels weak and eating small amounts frequently to maintain her blood sugar. She and her husband state understanding  She is here today because she felt dizzy again, "like I might pass out." However she did not pass out; has not passed out during the few years she has struggled with this dizziness This is the same type of dizziness that she hs had for years now. She may notice this every couple of days Nothing was different today as far as her dizziness. However she also notes that when she is hungry she feels nauseated.   She has been checking her blood sugar since she got her machine just a few days ago. She has checked her sugar 6 times, all between (before eating in the morning) 79 and 143 (post prandial)  She also had an MRI of her head a month ago- normal    BP Readings from Last 3 Encounters:  04/05/15 108/60  01/08/15 110/72  12/07/14 108/80     Wt Readings from Last 3 Encounters:  04/05/15 133 lb (60.328 kg)  01/08/15 128 lb 3.2 oz (58.151 kg)  12/07/14 126 lb 9.6 oz (57.425 kg)     Past Medical History  Diagnosis Date  . 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 monitor labs yearly   Past Surgical History  Procedure Laterality Date  . No prior surgery     Social History   Social History  . Marital Status: Married    Spouse Name: Huntley DecSara  . Number of Children: 2  . Years of Education: 12   Social History Main Topics  . Smoking status: Never Smoker   . Smokeless tobacco: Never Used  . Alcohol Use: No  . Drug Use: No  . Sexual Activity: Not Asked   Other Topics Concern  . None   Social History Narrative   Lives with  parents in a one story home.     Has 2 sons.    Works as a Advertising account plannernail technician.     Education: high school.   Family History  Problem Relation Age of Onset  . Hepatitis C Father    No Known Allergies Prior to Admission medications   Medication Sig Start Date End Date Taking? Authorizing Provider  Multiple Vitamin (MULTIVITAMIN) capsule Take 1 capsule by mouth daily.   Yes Historical Provider, MD     ROS: The patient denies fevers, chills, night sweats, unintentional weight loss, chest pain, palpitations, wheezing, dyspnea on exertion, nausea, vomiting, abdominal pain, dysuria, hematuria, melena, numbness, weakness, or tingling.  All other systems have been reviewed and were otherwise negative with the exception of those mentioned in the HPI and as above.    PHYSICAL EXAM: Filed Vitals:   04/05/15 1543  BP: 108/60  Pulse: 90  Temp: 99 F (37.2 C)  Resp: 14   Body mass index is 26.85 kg/(m^2).   General: Alert, no acute distress HEENT:  Normocephalic, atraumatic, oropharynx patent. EOMI, PERRLA Cardiovascular:  Regular rate and rhythm, no rubs murmurs or gallops.  No Carotid bruits, radial pulse intact. No pedal edema.  Respiratory: Clear to auscultation bilaterally.  No wheezes, rales, or rhonchi.  No cyanosis, no use of accessory musculature Abdominal: No organomegaly, abdomen is soft and non-tender, positive bowel sounds. No masses. Skin: No rashes. Neurologic: Facial musculature symmetric. Psychiatric: Patient acts appropriately throughout our interaction. Lymphatic: No cervical or submandibular lymphadenopathy Musculoskeletal: Gait intact. No edema, tenderness   LABS: Results for orders placed or performed in visit on 01/08/15  POCT urine pregnancy  Result Value Ref Range   Preg Test, Ur Negative Negative     EKG/XRAY:   [facility] Neg for any acute cardiopulm process   ASSESSMENT/PLAN: Encounter Diagnoses  Name Primary?  .  Hypoglycemia Yes  . Cough   . Chest pressure    Refer to endocrinologist for dizziness and weakness and frequent feelings of hypoglycemic sxs  even 1-2 hours after she eats. She states she has never dipped below 70 BG but feels bad when it is low. She has tried altering her eating habits and keeping a sugar log. ? Insulinoma. She will bring it to the endocrinologist.  Cough is pleuritic in nature , likely viral syndrome improved. Rx Tessalon perles.  Fu with endo   Gross sideeffects, risk and benefits, and alternatives of medications d/w patient. Patient is aware that all medications have potential sideeffects and we are unable to predict every sideeffect or drug-drug  interaction that may occur.  Darik Massing DO  04/05/2015 4:17 PM

## 2015-04-07 ENCOUNTER — Other Ambulatory Visit: Payer: Self-pay | Admitting: Family Medicine

## 2015-04-07 DIAGNOSIS — E162 Hypoglycemia, unspecified: Secondary | ICD-10-CM

## 2015-04-30 ENCOUNTER — Encounter: Payer: Self-pay | Admitting: Internal Medicine

## 2015-04-30 ENCOUNTER — Ambulatory Visit (INDEPENDENT_AMBULATORY_CARE_PROVIDER_SITE_OTHER): Payer: 59 | Admitting: Internal Medicine

## 2015-04-30 VITALS — BP 114/60 | HR 114 | Temp 98.2°F | Resp 12 | Ht 59.5 in | Wt 130.0 lb

## 2015-04-30 DIAGNOSIS — R42 Dizziness and giddiness: Secondary | ICD-10-CM

## 2015-04-30 LAB — FOLLICLE STIMULATING HORMONE: FSH: 7.9 m[IU]/mL

## 2015-04-30 LAB — CORTISOL: CORTISOL PLASMA: 14.2 ug/dL

## 2015-04-30 LAB — LUTEINIZING HORMONE: LH: 24.49 m[IU]/mL

## 2015-04-30 NOTE — Patient Instructions (Addendum)
Please stop at the lab.  We will schedule another appointment if labs return abnormal.

## 2015-04-30 NOTE — Progress Notes (Signed)
Patient ID: April Gibson, female   DOB: [birth year], 39 y.o.   MRN: [id number]  HPI: April Gibson is a 39 y.o.-year-old female, referred by Dr. Conley Rolls, for management of increased hunger.  She started to feel hypoglycemic sxs in 07/2014 >> sent to neurology and cardiology >> no abnormal findings.   She is having sxs if she is hungry.  She wakes up in the morning and feels no symptoms, however, she then goes for a walk for 25-30 min >> immediately after, she starts feeling hungry and has to eat right away. If she does not eat >> weakness. If eats >> sxs resolve (no postprandial sxs).  She describes that the following symptoms start if she delays a meal: - +  Intense hunger - + mild tremors - no palpitations - +  Weakness and sensation of passing out - no HAs - + anxiety  Lowest CBG reading: 69 -  Per review of her chart, at home 80s -  Per review of her log. No fasting hypoglycemia. Last HbA1c: Lab Results  Component Value Date   HGBA1C 5.4 10/08/2014   HGBA1C 4.6 08/06/2014   She gained weight 10 lbs in last 9 mo. She isxercising regularly: 25-30 min a day every am.   No meds with hypoglycemia side effects on her med list.  She takes a multivitamin daily.   She eats 3 meals a day + snacks: 3-4: fruit, chips, nuts.  - Breakfast: eggs + bread - Lunch: meat + veggies + rice - Dinner: seafood or other meat, veggies She has seen nutrition in the past.  - no CKD, last BUN/creatinine:  Lab Results  Component Value Date   BUN 8 12/07/2014   CREATININE 0.48* 12/07/2014   Previous thyroid tests have been normal: Lab Results  Component Value Date   TSH 1.212 09/20/2014   Her menses are regular, but sometimes lighter. + spotting.   I reviewed her chart and she also has a history of hep B.   She has 2 children: 8 and 4 y/o.   No DM FH.   ROS: Constitutional: + weight gain, no fatigue, no subjective hyperthermia/hypothermia Eyes: + blurry vision, no xerophthalmia ENT: no sore throat,  no nodules palpated in throat, no dysphagia/odynophagia, no hoarseness Cardiovascular: no CP/SOB/palpitations/leg swelling Respiratory: no cough/SOB Gastrointestinal: no N/V/D/C Musculoskeletal: no muscle/joint aches Skin: no rashes, + itching Neurological: no tremors/numbness/tingling/dizziness, + HA Psychiatric: no depression/+ anxiety -  Only getting hungry  Past Medical History  Diagnosis Date  . 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 monitor labs yearly   Past Surgical History  Procedure Laterality Date  . No prior surgery     Social History   Social History  . Marital Status: Married    Spouse Name: Huntley Dec  . Number of Children: 2  . Years of Education: 12   Occupational History  .    Social History Main Topics  . Smoking status: Never Smoker   . Smokeless tobacco: Never Used  . Alcohol Use: No  . Drug Use: No   Social History Narrative   Lives with parents in a one story home.     Has 2 sons.    Works as a Advertising account planner.     Education: high school.   Current Outpatient Prescriptions on File Prior to Visit  Medication Sig Dispense Refill  . Multiple Vitamin (MULTIVITAMIN) capsule Take 1 capsule by mouth daily.  No current facility-administered medications on file prior to visit.   No Known Allergies Family History  Problem Relation Age of Onset  . Hepatitis C Father    PE: BP 114/60 mmHg  Pulse 114  Temp(Src) 98.2 F (36.8 C) (Oral)  Resp 12  Ht 4' 11.5" (1.511 m)  Wt 130 lb (58.968 kg)  BMI 25.83 kg/m2  SpO2 96% Wt Readings from Last 3 Encounters:  04/30/15 130 lb (58.968 kg)  04/05/15 133 lb (60.328 kg)  01/08/15 128 lb 3.2 oz (58.151 kg)   Constitutional: overweight -  Abdominal distribution, in NAD Eyes: PERRLA, EOMI, no exophthalmos ENT: moist mucous membranes, no thyromegaly, no cervical lymphadenopathy Cardiovascular: RRR, No MRG Respiratory: CTA  B Gastrointestinal: abdomen soft, NT, ND, BS+ Musculoskeletal: no deformities, strength intact in all 4 Skin: moist, warm, no rashes Neurological: no tremor with outstretched hands, DTR normal in all 4  ASSESSMENT: 1. Dizziness, weakness - when hungry  PLAN:  1. Patient with symptoms of hypoglycemia, with CBG levels normal and out of proportion compared with her symptoms. She has a sensation of weakness and  also anxiety, tremors, and intense hunger. She tells me that she never has symptoms in the morning , upon waking up, but she does not eat immediately after she wakes up and actually goes for a  25-30 minute walk first. When she returns from her walk , she feels very hungry and needs to eat because otherwise she will develop the above symptoms. All the symptoms resolve if she eats. She has checked her sugars several times when she felt symptomatic and hungry and they have all been normal. She brings a list of her blood sugars and they are usually between 93 and the 100 when she wakes up,  And increased 97-155 after she eats. She also check some sugars at bedtime and these are between 86-136.  - I had a long discussion with the patient about the definition of hypoglycemia, which is different in persons with and without diabetes. We discussed that in patients without diabetes, the CBG threshold of 70 does not apply. Therefore, a sugar in the 60s is perfectly normal, and it can be even lower than that especially in young women and athletes.  - Since she does not have associated low blood sugars with her symptoms, we cannot attribute her symptoms to hypoglycemia.  I will check several tests, though, investigate for other etiologies of her symptoms of weakness and dizziness (especially since she is also tachycardic today)  And also since she is telling me that she has  seen changes in her menstrual cycles since the symptoms began -  They can be lighter and may have only spotting.  She is now at the  beginning of her menstrual cycle. We will check: LH, FSH, estradiol to investigate her change in menstrual cycles. Will check a cortisol and ACTH to investigate for adrenal insufficiency. We will check plasma metanephrines to check for pheochromocytoma, which I highly doubt but would like to rule out. Orders Placed This Encounter  Procedures  . Luteinizing hormone  . Cortisol  . ACTH  . Follicle stimulating hormone  . Estradiol  . Metanephrines, plasma  -  I did advise the patient that she needs to eat as soon a she wakes up, and before going for a walk. Also, having 3 main meals with 3-4 snacks is a good idea, but I advised her to use lower calorie snacks to avoid weight gain. -  We'll schedule another  appointment if the labs above are abnormal  Component     Latest Ref Rng 04/30/2015  Metanephrine, Free     <=57 pg/mL <25  Normetanephrine, Free     <=148 pg/mL 94  Total Metanephrines-Plasma     <=205 pg/mL 94  Estradiol, Free      1.96  Estradiol      101  LH      24.49  Cortisol, Plasma      14.2  C206 ACTH     6 - 50 pg/mL 28  FSH      7.9   All labs normal: no sign of pheochromocytoma, adrenal insufficiency, abnormal estradiol, or abnormal gonadotropins (seems to be closer to mid-cycle).

## 2015-05-05 LAB — METANEPHRINES, PLASMA
NORMETANEPHRINE FREE: 94 pg/mL (ref <=148)
Total Metanephrines-Plasma: 94 pg/mL (ref <=205)

## 2015-05-05 LAB — ACTH: C206 ACTH: 28 pg/mL (ref 6–50)

## 2015-05-07 LAB — ESTRADIOL, FREE
ESTRADIOL FREE: 1.96 pg/mL
Estradiol: 101 pg/mL

## 2015-07-09 ENCOUNTER — Encounter: Payer: Self-pay | Admitting: Family Medicine

## 2015-07-09 ENCOUNTER — Ambulatory Visit (INDEPENDENT_AMBULATORY_CARE_PROVIDER_SITE_OTHER): Payer: BLUE CROSS/BLUE SHIELD | Admitting: Family Medicine

## 2015-07-09 VITALS — BP 126/70 | HR 93 | Temp 98.4°F | Resp 16 | Ht 59.0 in | Wt 132.0 lb

## 2015-07-09 DIAGNOSIS — R42 Dizziness and giddiness: Secondary | ICD-10-CM

## 2015-07-09 LAB — POCT CBC
GRANULOCYTE PERCENT: 53.3 % (ref 37–80)
HEMATOCRIT: 40.9 % (ref 37.7–47.9)
Hemoglobin: 13.9 g/dL (ref 12.2–16.2)
Lymph, poc: 3.9 — AB (ref 0.6–3.4)
MCH: 30.8 pg (ref 27–31.2)
MCHC: 34 g/dL (ref 31.8–35.4)
MCV: 90.5 fL (ref 80–97)
MID (CBC): 0.8 (ref 0–0.9)
MPV: 7.8 fL (ref 0–99.8)
PLATELET COUNT, POC: 241 10*3/uL (ref 142–424)
POC GRANULOCYTE: 5.3 (ref 2–6.9)
POC LYMPH PERCENT: 38.7 %L (ref 10–50)
POC MID %: 8 % (ref 0–12)
RBC: 4.52 M/uL (ref 4.04–5.48)
RDW, POC: 12.1 %
WBC: 10 10*3/uL (ref 4.6–10.2)

## 2015-07-09 LAB — GLUCOSE, POCT (MANUAL RESULT ENTRY): POC Glucose: 80 mg/dl (ref 70–99)

## 2015-07-09 LAB — POCT GLYCOSYLATED HEMOGLOBIN (HGB A1C): Hemoglobin A1C: 5.2

## 2015-07-09 NOTE — Progress Notes (Signed)
Subjective:    Patient ID: April Gibson, female    DOB: 01/09/1976, 40 y.o.   MRN: [id number]  07/09/2015  every two hours feel like she need to eat   HPI This 40 y.o. female presents for evaluation of persistent dizziness/lightheadedness for the past year.  Every 2-3 hours, when becomes hungry must eat immediately or will become lightheaded.  No falls; no syncope.  After eating something, takes 5 minutes to feel better.  Wakes up and sugar is 80-90; sugar at bedtime 110-111.   B:  Egg 1, biscuit, hashbrown, noodles, water or soft drink/coke  (8:00) Snack: chip or banana 10:30 or 11:00 Lunch: at 12:00pm; meat (chicken/pork/fish, vegetable, lettuce), sprite or coke or water Snack:  Cereal, milk, candy chocolate  3:00pm Supper: rice, meat (chicken/pork/beef), water soft drink 9:00pm  Neurology consultation; MRI brain negative, carotid doppler) Cardiologist work up negative (echo, Event Monitor) Endocrinologist work up negative.   S/p chest xray 03/2015.   Periods are regular; every 28-35 days.  Gaining weight. Afraid going to pass out.  Onset one year.  S/p gynecology evaluation; last year; due to return to gynecologist in upcoming 3 months.  Menses have changed.  Pink for 4-5 days and then more.  More watery than bloody.      Review of Systems  Constitutional: Negative for fever, chills, diaphoresis and fatigue.  Eyes: Negative for visual disturbance.  Respiratory: Negative for cough and shortness of breath.   Cardiovascular: Negative for chest pain, palpitations and leg swelling.  Gastrointestinal: Negative for nausea, vomiting, abdominal pain, diarrhea and constipation.  Endocrine: Negative for cold intolerance, heat intolerance, polydipsia, polyphagia and polyuria.  Neurological: Positive for dizziness and light-headedness. Negative for tremors, seizures, syncope, facial asymmetry, speech difficulty, weakness, numbness and headaches.    Past Medical History  Diagnosis Date  .  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 monitor labs yearly   Past Surgical History  Procedure Laterality Date  . No prior surgery     No Known Allergies Current Outpatient Prescriptions  Medication Sig Dispense Refill  . Ibuprofen (ADVIL) 200 MG CAPS Take 2 capsules by mouth as needed.    . Multiple Vitamin (MULTIVITAMIN) capsule Take 1 capsule by mouth daily.     No current facility-administered medications for this visit.   Social History   Social History  . Marital Status: Married    Spouse Name: Huntley Dec  . Number of Children: 2  . Years of Education: 12   Occupational History  . nail technician    Social History Main Topics  . Smoking status: Never Smoker   . Smokeless tobacco: Never Used  . Alcohol Use: No  . Drug Use: No  . Sexual Activity: Not on file   Other Topics Concern  . Not on file   Social History Narrative   Lives with parents in a one story home.     Has 2 sons.    Works as a Advertising account planner.     Education: high school.   Family History  Problem Relation Age of Onset  . Hepatitis C Father        Objective:    BP 126/70 mmHg  Pulse 93  Temp(Src) 98.4 F (36.9 C) (Oral)  Resp 16  Ht  (1.499 m)  Wt 132 lb (59.875 kg)  BMI 26.65 kg/m2  SpO2 98%  LMP 07/09/2015 Physical Exam  Constitutional: She is oriented to person,  place, and time. She appears well-developed and well-nourished. No distress.  HENT:  Head: Normocephalic and atraumatic.  Right Ear: External ear normal.  Left Ear: External ear normal.  Nose: Nose normal.  Mouth/Throat: Oropharynx is clear and moist.  Eyes: Conjunctivae and EOM are normal. Pupils are equal, round, and reactive to light.  Neck: Normal range of motion. Neck supple. Carotid bruit is not present. No thyromegaly present.  Cardiovascular: Normal rate, regular rhythm, normal heart sounds and intact distal pulses.  Exam reveals no gallop  and no friction rub.   No murmur heard. Pulmonary/Chest: Effort normal and breath sounds normal. She has no wheezes. She has no rales.  Abdominal: Soft. Bowel sounds are normal. She exhibits no distension and no mass. There is no tenderness. There is no rebound and no guarding.  Lymphadenopathy:    She has no cervical adenopathy.  Neurological: She is alert and oriented to person, place, and time. No cranial nerve deficit. She exhibits normal muscle tone. Coordination normal.  Skin: Skin is warm and dry. No rash noted. She is not diaphoretic. No erythema. No pallor.  Psychiatric: She has a normal mood and affect. Her behavior is normal. Judgment and thought content normal.        Assessment & Plan:   1. Dizziness   2. Lightheaded    High protein meals six times per day; eat small portions to avoid weight loss. -obtain a few remaining labs that have not previously been obtained. -very extensive work up.   Orders Placed This Encounter  Procedures  . Iron  . IBC panel  . Vitamin B12  . VITAMIN D 25 Hydroxy (Vit-D Deficiency, Fractures)  . Comprehensive metabolic panel  . POCT glycosylated hemoglobin (Hb A1C)  . POCT CBC  . POCT glucose (manual entry)   No orders of the defined types were placed in this encounter.    No Follow-up on file.    Nigel Wessman Paulita Fujita, M.D. [facility] [address] [place], [place]  [address] [phone] phone [phone] fax

## 2015-07-09 NOTE — Patient Instructions (Addendum)
Because you received labwork today, you will receive an invoice from [employer]. Please contact [facility] at [phone] with questions or concerns regarding your invoice. Our billing staff will not be able to assist you with those questions.  You will be contacted with the lab results as soon as they are available. The fastest way to get your results is to activate your My Chart account. Instructions are located on the last page of this paperwork. If you have not heard from [place] regarding the results in 2 weeks, please contact this office.   I RECOMMEND A HIGH PROTEIN DIET. YOU SHOULD EAT 50 GRAMS OF PROTEIN DAILY.  DECREASE SUGAR INTAKE AND INCREASE VEGETABLE AND FRUIT INTAKE.      Why follow it? Research shows. . Those who follow the Mediterranean diet have a reduced risk of heart disease  . The diet is associated with a reduced incidence of Parkinson's and Alzheimer's diseases . People following the diet may have longer life expectancies and lower rates of chronic diseases  . The Dietary Guidelines for Americans recommends the Mediterranean diet as an eating plan to promote health and prevent disease  What Is the Mediterranean Diet?  . Healthy eating plan based on typical foods and recipes of Mediterranean-style cooking . The diet is primarily a plant based diet; these foods should make up a majority of meals   Starches - Plant based foods should make up a majority of meals - They are an important sources of vitamins, minerals, energy, antioxidants, and fiber - Choose whole grains, foods high in fiber and minimally processed items  - Typical grain sources include wheat, oats, barley, corn, brown rice, bulgar, farro, millet, polenta, couscous  - Various types of beans include chickpeas, lentils, fava beans, black beans, white beans   Fruits  Veggies - Large quantities of antioxidant rich fruits & veggies; 6 or more servings  - Vegetables can be eaten raw or lightly  drizzled with oil and cooked  - Vegetables common to the traditional Mediterranean Diet include: artichokes, arugula, beets, broccoli, brussel sprouts, cabbage, carrots, celery, collard greens, cucumbers, eggplant, kale, leeks, lemons, lettuce, mushrooms, okra, onions, peas, peppers, potatoes, pumpkin, radishes, rutabaga, shallots, spinach, sweet potatoes, turnips, zucchini - Fruits common to the Mediterranean Diet include: apples, apricots, avocados, cherries, clementines, dates, figs, grapefruits, grapes, melons, nectarines, oranges, peaches, pears, pomegranates, strawberries, tangerines  Fats - Replace butter and margarine with healthy oils, such as olive oil, canola oil, and tahini  - Limit nuts to no more than a handful a day  - Nuts include walnuts, almonds, pecans, pistachios, pine nuts  - Limit or avoid candied, honey roasted or heavily salted nuts - Olives are central to the [employer] - can be eaten whole or used in a variety of dishes   Meats Protein - Limiting red meat: no more than a few times a month - When eating red meat: choose lean cuts and keep the portion to the size of deck of cards - Eggs: approx. 0 to 4 times a week  - Fish and lean poultry: at least 2 a week  - Healthy protein sources include, chicken, [place], lean beef, lamb - Increase intake of seafood such as tuna, salmon, trout, mackerel, shrimp, scallops - Avoid or limit high fat processed meats such as sausage and bacon  Dairy - Include moderate amounts of low fat dairy products  - Focus on healthy dairy such as fat free yogurt, skim milk, low or reduced fat cheese -  Limit dairy products higher in fat such as whole or 2% milk, cheese, ice cream  Alcohol - Moderate amounts of red wine is ok  - No more than 5 oz daily for women (all ages) and men older than age 54  - No more than 10 oz of wine daily for men younger than 98  Other - Limit sweets and other desserts  - Use herbs and spices instead of salt to  flavor foods  - Herbs and spices common to the traditional Mediterranean Diet include: basil, bay leaves, chives, cloves, cumin, fennel, garlic, lavender, marjoram, mint, oregano, parsley, pepper, rosemary, sage, savory, sumac, tarragon, thyme   It's not just a diet, it's a lifestyle:  . The Mediterranean diet includes lifestyle factors typical of those in the region  . Foods, drinks and meals are best eaten with others and savored . Daily physical activity is important for overall good health . This could be strenuous exercise like running and aerobics . This could also be more leisurely activities such as walking, housework, yard-work, or taking the stairs . Moderation is the key; a balanced and healthy diet accommodates most foods and drinks . Consider portion sizes and frequency of consumption of certain foods   Meal Ideas & Options:  . Breakfast:  o Whole wheat toast or whole wheat English muffins with peanut butter & hard boiled egg o Steel cut oats topped with apples & cinnamon and skim milk  o Fresh fruit: banana, strawberries, melon, berries, peaches  o Smoothies: strawberries, bananas, greek yogurt, peanut butter o Low fat greek yogurt with blueberries and granola  o Egg white omelet with spinach and mushrooms o Breakfast couscous: whole wheat couscous, apricots, skim milk, cranberries  . Sandwiches:  o Hummus and grilled vegetables (peppers, zucchini, squash) on whole wheat bread   o Grilled chicken on whole wheat pita with lettuce, tomatoes, cucumbers or tzatziki  o Tuna salad on whole wheat bread: tuna salad made with greek yogurt, olives, red peppers, capers, green onions o Garlic rosemary lamb pita: lamb sauted with garlic, rosemary, salt & pepper; add lettuce, cucumber, greek yogurt to pita - flavor with lemon juice and black pepper  . Seafood:  o Mediterranean grilled salmon, seasoned with garlic, basil, parsley, lemon juice and black pepper o Shrimp, lemon, and spinach  whole-grain pasta salad made with low fat greek yogurt  o Seared scallops with lemon orzo  o Seared tuna steaks seasoned salt, pepper, coriander topped with tomato mixture of olives, tomatoes, olive oil, minced garlic, parsley, green onions and cappers  . Meats:  o Herbed greek chicken salad with kalamata olives, cucumber, feta  o Red bell peppers stuffed with spinach, bulgur, lean ground beef (or lentils) & topped with feta   o Kebabs: skewers of chicken, tomatoes, onions, zucchini, squash  o [place] burgers: made with red onions, mint, dill, lemon juice, feta cheese topped with roasted red peppers . Vegetarian o Cucumber salad: cucumbers, artichoke hearts, celery, red onion, feta cheese, tossed in olive oil & lemon juice  o Hummus and whole grain pita points with a greek salad (lettuce, tomato, feta, olives, cucumbers, red onion) o Lentil soup with celery, carrots made with vegetable broth, garlic, salt and pepper  o Tabouli salad: parsley, bulgur, mint, scallions, cucumbers, tomato, radishes, lemon juice, olive oil, salt and pepper.

## 2015-07-10 LAB — VITAMIN B12: VITAMIN B 12: 504 pg/mL (ref 200–1100)

## 2015-07-10 LAB — COMPREHENSIVE METABOLIC PANEL
ALBUMIN: 4.6 g/dL (ref 3.6–5.1)
ALK PHOS: 62 U/L (ref 33–115)
ALT: 87 U/L — AB (ref 6–29)
AST: 61 U/L — ABNORMAL HIGH (ref 10–30)
BILIRUBIN TOTAL: 0.5 mg/dL (ref 0.2–1.2)
BUN: 8 mg/dL (ref 7–25)
CALCIUM: 9.5 mg/dL (ref 8.6–10.2)
CO2: 29 mmol/L (ref 20–31)
Chloride: 102 mmol/L (ref 98–110)
Creat: 0.66 mg/dL (ref 0.50–1.10)
GLUCOSE: 81 mg/dL (ref 65–99)
POTASSIUM: 4.4 mmol/L (ref 3.5–5.3)
Sodium: 139 mmol/L (ref 135–146)
Total Protein: 8.1 g/dL (ref 6.1–8.1)

## 2015-07-10 LAB — IRON: IRON: 54 ug/dL (ref 40–190)

## 2015-07-10 LAB — IBC PANEL
%SAT: 17 % (ref 11–50)
TIBC: 313 ug/dL (ref 250–450)
UIBC: 259 ug/dL (ref 125–400)

## 2015-07-11 LAB — VITAMIN D 25 HYDROXY (VIT D DEFICIENCY, FRACTURES): Vit D, 25-Hydroxy: 28 ng/mL — ABNORMAL LOW (ref 30–100)

## 2015-07-25 ENCOUNTER — Other Ambulatory Visit: Payer: Self-pay | Admitting: Family Medicine

## 2015-07-25 DIAGNOSIS — R945 Abnormal results of liver function studies: Principal | ICD-10-CM

## 2015-07-25 DIAGNOSIS — R7989 Other specified abnormal findings of blood chemistry: Secondary | ICD-10-CM

## 2015-07-29 ENCOUNTER — Telehealth: Payer: Self-pay

## 2015-07-29 NOTE — Telephone Encounter (Signed)
Dr. Katrinka BlazingSmith, I spoke with Ms. April Gibson on the phone about her lab results and she verbalized understanding about everything. She has agreed to come back in 1-2 weeks for a repeat liver function test.

## 2015-08-10 ENCOUNTER — Other Ambulatory Visit (INDEPENDENT_AMBULATORY_CARE_PROVIDER_SITE_OTHER): Payer: BLUE CROSS/BLUE SHIELD

## 2015-08-10 DIAGNOSIS — R7989 Other specified abnormal findings of blood chemistry: Secondary | ICD-10-CM

## 2015-08-10 DIAGNOSIS — R945 Abnormal results of liver function studies: Principal | ICD-10-CM

## 2015-08-10 LAB — COMPREHENSIVE METABOLIC PANEL
ALK PHOS: 57 U/L (ref 33–115)
ALT: 52 U/L — AB (ref 6–29)
AST: 39 U/L — AB (ref 10–30)
Albumin: 4.4 g/dL (ref 3.6–5.1)
BILIRUBIN TOTAL: 0.9 mg/dL (ref 0.2–1.2)
BUN: 9 mg/dL (ref 7–25)
CO2: 24 mmol/L (ref 20–31)
CREATININE: 0.61 mg/dL (ref 0.50–1.10)
Calcium: 9.3 mg/dL (ref 8.6–10.2)
Chloride: 102 mmol/L (ref 98–110)
GLUCOSE: 158 mg/dL — AB (ref 65–99)
Potassium: 4.1 mmol/L (ref 3.5–5.3)
SODIUM: 138 mmol/L (ref 135–146)
Total Protein: 7.6 g/dL (ref 6.1–8.1)

## 2015-08-11 LAB — ACUTE HEP PANEL AND HEP B SURFACE AB
HCV Ab: NEGATIVE
HEP B S AB: NEGATIVE
Hep A IgM: NONREACTIVE
Hep B C IgM: NONREACTIVE
Hepatitis B Surface Ag: POSITIVE — AB

## 2015-08-11 LAB — HEPATITIS B SURF AG CONFIRMATION: HEPATITIS B SURFACE ANTIGEN CONFIRMATION: POSITIVE — AB

## 2015-09-25 IMAGING — CR DG CERVICAL SPINE COMPLETE 4+V
5 series · 5 of 5 positions shown · non-contrast
Comparison: None.

CLINICAL DATA: Acute dizziness with neck flexion

EXAM:
CERVICAL SPINE  4+ VIEWS

[lpo]
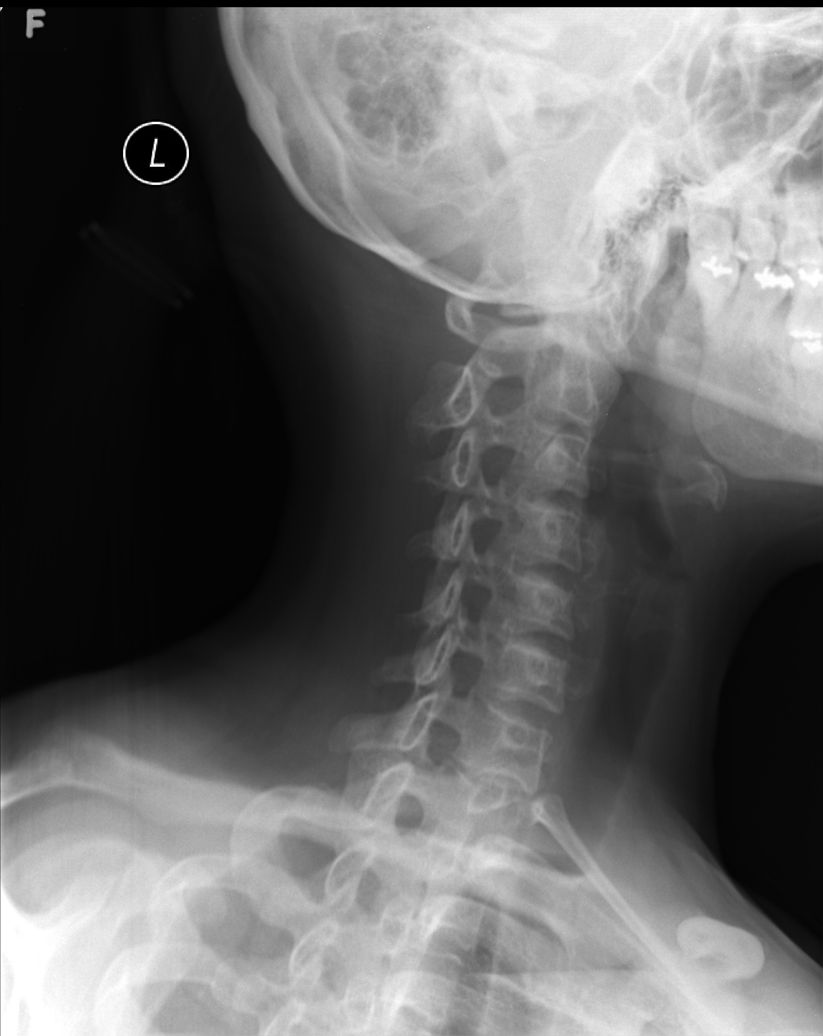

[lateral]
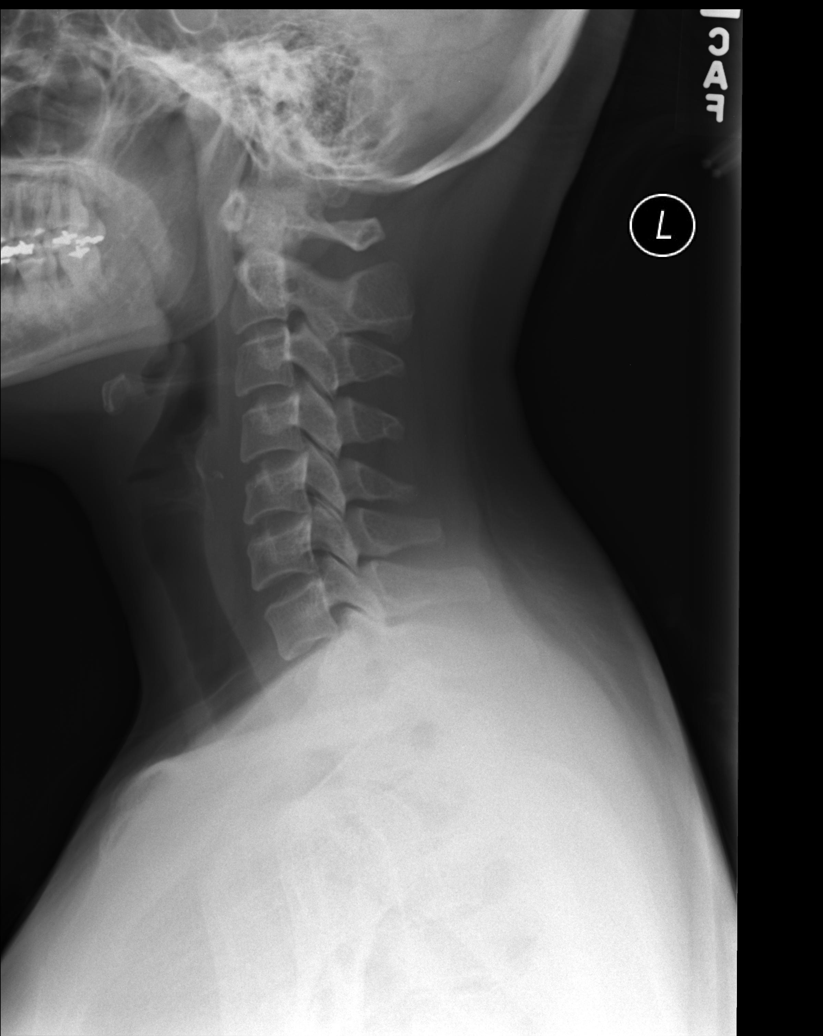

[rpo]
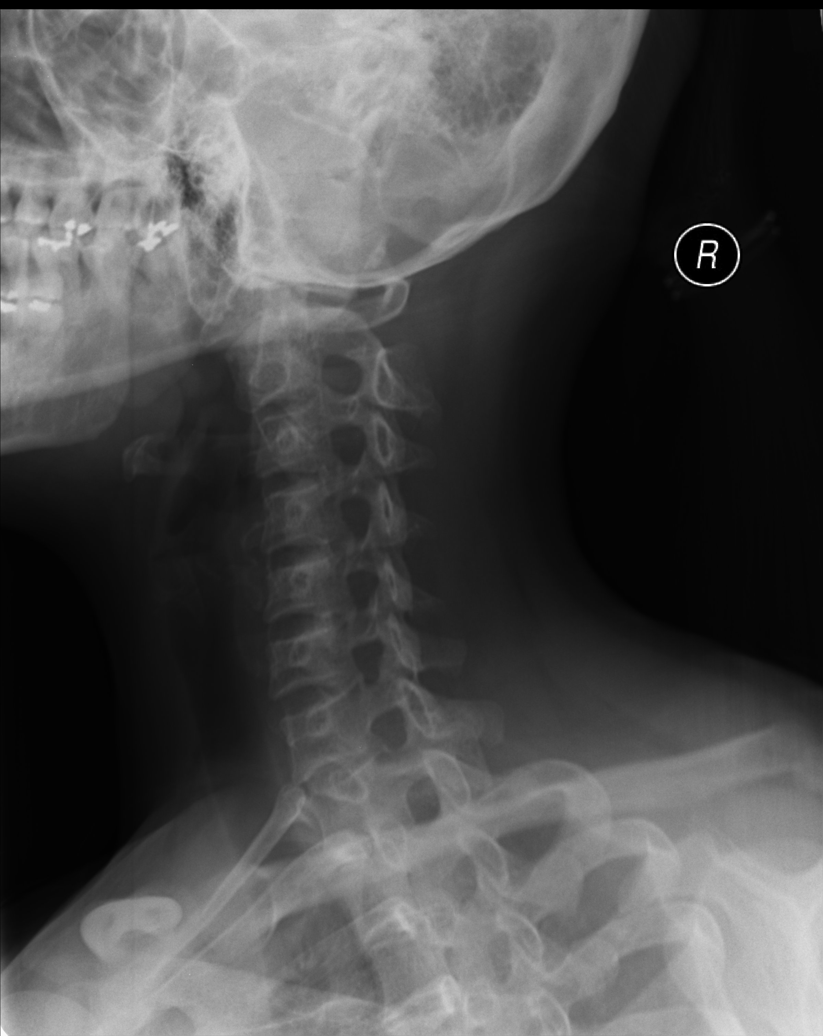

[AP]
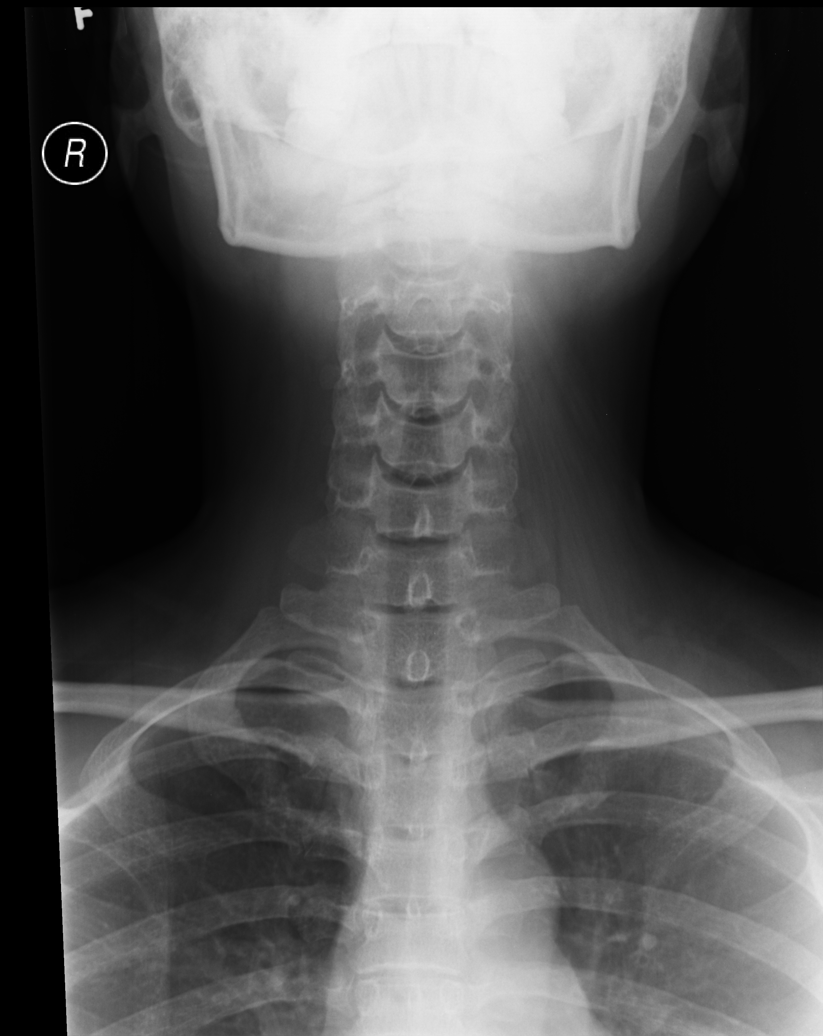

[ap open mouth]
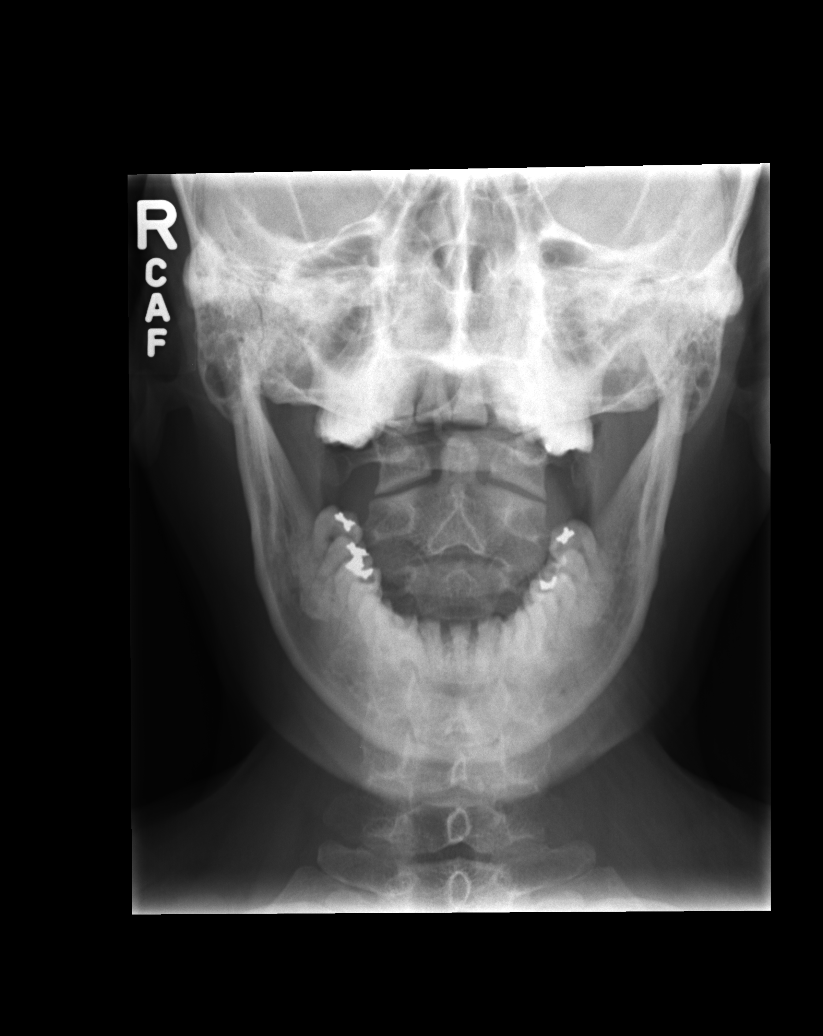

[5 of 5 positions shown; findings below may reference images not displayed]

FINDINGS: There is no evidence of cervical spine fracture or prevertebral soft
tissue swelling. Alignment is normal. No other significant bone
abnormalities are identified.
IMPRESSION: Negative cervical spine radiographs.

## 2015-09-28 ENCOUNTER — Ambulatory Visit (INDEPENDENT_AMBULATORY_CARE_PROVIDER_SITE_OTHER): Payer: BLUE CROSS/BLUE SHIELD | Admitting: Physician Assistant

## 2015-09-28 VITALS — BP 122/72 | HR 108 | Temp 99.0°F | Resp 17 | Ht 59.5 in | Wt 122.0 lb

## 2015-09-28 DIAGNOSIS — J020 Streptococcal pharyngitis: Secondary | ICD-10-CM

## 2015-09-28 DIAGNOSIS — R070 Pain in throat: Secondary | ICD-10-CM | POA: Diagnosis not present

## 2015-09-28 LAB — POCT RAPID STREP A (OFFICE): Rapid Strep A Screen: POSITIVE — AB

## 2015-09-28 MED ORDER — AMOXICILLIN 500 MG PO CAPS: 500.0000 mg | ORAL_CAPSULE | Freq: Two times a day (BID) | ORAL | Status: AC

## 2015-09-28 NOTE — Progress Notes (Signed)
[facility] [address], [place] [address] [phone]  Date:  09/28/2015   Name:  April SkeetersMai N Habib   DOB:  08/25/1975   MRN:  [id number]  PCP:  Rockne Coonshao Phuong Le, DO    History of Present Illness:  April SkeetersMai N Mance is a 40 y.o. female patient who [facility] chief complaint of throat pain and headache. Patient reports 3 days ago feeling fever and chills. She had head pain that was in the back of her head. The pain radiates to the back of her neck. She also stated that she has great throat pain. It hurts to swallow and eat foods. She has no congestion or cough. She has had nausea body aches and some dizziness with turning her head very quickly. Pain in her throat may radiate to her ears. She has no shortness breath or trouble breathing. She has tried Advil 200 mg which did not help very much.     Patient Active Problem List   Diagnosis Date Noted  . Palpitations 11/24/2014  . Dizziness and giddiness 09/30/2014  . Orthostatic hypotension 09/30/2014  . 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 monitor labs yearly    Past Surgical History  Procedure Laterality Date  . No prior surgery      Social History  Substance Use Topics  . Smoking status: Never Smoker   . Smokeless tobacco: Never Used  . Alcohol Use: No    Family History  Problem Relation Age of Onset  . Hepatitis C Father     No Known Allergies  Medication list has been reviewed and updated.  Current Outpatient Prescriptions on File Prior to Visit  Medication Sig Dispense Refill  . Ibuprofen (ADVIL) 200 MG CAPS Take 2 capsules by mouth as needed.    . Multiple Vitamin (MULTIVITAMIN) capsule Take 1 capsule by mouth daily.     No current facility-administered medications on file prior to visit.     ROS ROS otherwise unremarkable unless   Physical Examination: BP 122/72 mmHg  Pulse 108  Temp(Src) 99 F (37.2 C) (Oral)  Resp 17  Ht 4' 11.5" (1.511 m)  Wt 122 lb (55.339 kg)  BMI 24.24 kg/m2  SpO2 98%  LMP 09/16/2015 Ideal Body Weight: Weight in (lb) to have BMI = 25: 125.6  Physical Exam  Constitutional: She is oriented to person, place, and time. She appears well-developed and well-nourished. No distress.  HENT:  Head: Normocephalic and atraumatic.  Right Ear: Tympanic membrane, external ear and ear canal normal.  Left Ear: Tympanic membrane, external ear and ear canal normal.  Nose: No mucosal edema or rhinorrhea. Right sinus exhibits no maxillary sinus tenderness and no frontal sinus tenderness. Left sinus exhibits no maxillary sinus tenderness and no frontal sinus tenderness.  Mouth/Throat: No uvula swelling. Posterior oropharyngeal edema and posterior oropharyngeal erythema present. No oropharyngeal exudate.  Eyes: Conjunctivae and EOM are normal. Pupils are equal, round, and reactive to light.  Cardiovascular: Normal rate and regular rhythm.  Exam reveals no gallop, no distant heart sounds and no friction rub.   No murmur heard. Pulmonary/Chest: Effort normal. No respiratory distress. She has no decreased breath sounds. She has no wheezes. She has no rhonchi.  Lymphadenopathy:       Head (right side): No submandibular,  no tonsillar, no preauricular and no posterior auricular adenopathy present.       Head (left side): No submandibular, no tonsillar, no preauricular and no posterior auricular adenopathy present.  Neurological: She is alert and oriented to person, place, and time.  Skin: She is not diaphoretic.  Psychiatric: She has a normal mood and affect. Her behavior is normal.   Results for orders placed or performed in visit on 09/28/15  POCT rapid strep A  Result Value Ref Range   Rapid Strep A Screen Positive (A) Negative     Assessment and Plan: April Gibson is a 40 y.o. female who is here today for throat pain. --amoxicillin twice per day for 10 days. --advised anti-pyretic use of ibuprofen.  cepacol lozenges for pain. --rtc in 3 days if no improvement.   Streptococcal sore throat - Plan: amoxicillin (AMOXIL) 500 MG capsule  Throat pain - Plan: POCT rapid strep A, amoxicillin (AMOXIL) 500 MG capsule  Trena Platt, PA-C [facility] 09/28/2015 1:58 PM

## 2015-09-28 NOTE — Patient Instructions (Addendum)
IF you received an x-ray today, you will receive an invoice from [facility]. Please contact [facility] at [phone] with questions or concerns regarding your invoice.   IF you received labwork today, you will receive an invoice from [employer]. Please contact [facility] at [phone] with questions or concerns regarding your invoice.   Our billing staff will not be able to assist you with questions regarding bills from these companies.  You will be contacted with the lab results as soon as they are available. The fastest way to get your results is to activate your My Chart account. Instructions are located on the last page of this paperwork. If you have not heard from [place] regarding the results in 2 weeks, please contact this office.    Take advil for pain and fever. Use the cepacol lozenges which are over the counter,  for pain Strep Throat Strep throat is a bacterial infection of the throat. Your health care provider may call the infection tonsillitis or pharyngitis, depending on whether there is swelling in the tonsils or at the back of the throat. Strep throat is most common during the cold months of the year in children who are 49-49 years of age, but it can happen during any season in people of any age. This infection is spread from person to person (contagious) through coughing, sneezing, or close contact. CAUSES Strep throat is caused by the bacteria called Streptococcus pyogenes. RISK FACTORS This condition is more likely to develop in:  People who spend time in crowded places where the infection can spread easily.  People who have close contact with someone who has strep throat. SYMPTOMS Symptoms of this condition include:  Fever or chills.   Redness, swelling, or pain in the tonsils or throat.  Pain or difficulty when swallowing.  White or yellow spots on the tonsils or throat.  Swollen, tender glands in the neck or  under the jaw.  Red rash all over the body (rare). DIAGNOSIS This condition is diagnosed by performing a rapid strep test or by taking a swab of your throat (throat culture test). Results from a rapid strep test are usually ready in a few minutes, but throat culture test results are available after one or two days. TREATMENT This condition is treated with antibiotic medicine. HOME CARE INSTRUCTIONS Medicines  Take over-the-counter and prescription medicines only as told by your health care provider.  Take your antibiotic as told by your health care provider. Do not stop taking the antibiotic even if you start to feel better.  Have family members who also have a sore throat or fever tested for strep throat. They may need antibiotics if they have the strep infection. Eating and Drinking  Do not share food, drinking cups, or personal items that could cause the infection to spread to other people.  If swallowing is difficult, try eating soft foods until your sore throat feels better.  Drink enough fluid to keep your urine clear or pale yellow. General Instructions  Gargle with a salt-water mixture 3-4 times per day or as needed. To make a salt-water mixture, completely dissolve -1 tsp of salt in 1 cup of warm water.  Make sure that all household members wash their hands well.  Get plenty of rest.  Stay home from school or work until you have been taking antibiotics for 24 hours.  Keep all follow-up visits as told by your health care provider. This is important. SEEK MEDICAL CARE IF:  The glands in your neck continue to get bigger.  You develop a rash, cough, or earache.  You cough up a thick liquid that is green, yellow-brown, or bloody.  You have pain or discomfort that does not get better with medicine.  Your problems seem to be getting worse rather than better.  You have a fever. SEEK IMMEDIATE MEDICAL CARE IF:  You have new symptoms, such as vomiting, severe headache,  stiff or painful neck, chest pain, or shortness of breath.  You have severe throat pain, drooling, or changes in your voice.  You have swelling of the neck, or the skin on the neck becomes red and tender.  You have signs of dehydration, such as fatigue, dry mouth, and decreased urination.  You become increasingly sleepy, or you cannot wake up completely.  Your joints become red or painful.   This information is not intended to replace advice given to you by your health care provider. Make sure you discuss any questions you have with your health care provider.   Document Released: 04/22/2000 Document Revised: 01/14/2015 Document Reviewed: 08/18/2014 Elsevier Interactive Patient Education [employer]2016 Elsevier Inc.

## 2015-12-18 ENCOUNTER — Ambulatory Visit (INDEPENDENT_AMBULATORY_CARE_PROVIDER_SITE_OTHER): Payer: BLUE CROSS/BLUE SHIELD | Admitting: Family Medicine

## 2015-12-18 ENCOUNTER — Encounter: Payer: Self-pay | Admitting: Family Medicine

## 2015-12-18 VITALS — BP 104/66 | HR 111 | Temp 98.5°F | Resp 16 | Ht 59.0 in | Wt 121.8 lb

## 2015-12-18 DIAGNOSIS — R42 Dizziness and giddiness: Secondary | ICD-10-CM | POA: Diagnosis not present

## 2015-12-18 DIAGNOSIS — B181 Chronic viral hepatitis B without delta-agent: Secondary | ICD-10-CM

## 2015-12-18 DIAGNOSIS — G44219 Episodic tension-type headache, not intractable: Secondary | ICD-10-CM | POA: Diagnosis not present

## 2015-12-18 LAB — POCT CBC
Granulocyte percent: 59.6 %G (ref 37–80)
HEMATOCRIT: 41.5 % (ref 37.7–47.9)
Hemoglobin: 14.4 g/dL (ref 12.2–16.2)
LYMPH, POC: 1.9 (ref 0.6–3.4)
MCH, POC: 30.7 pg (ref 27–31.2)
MCHC: 34.8 g/dL (ref 31.8–35.4)
MCV: 88.1 fL (ref 80–97)
MID (cbc): 0.2 (ref 0–0.9)
MPV: 7.6 fL (ref 0–99.8)
PLATELET COUNT, POC: 254 10*3/uL (ref 142–424)
POC GRANULOCYTE: 3.5 (ref 2–6.9)
POC LYMPH PERCENT: 32.2 %L (ref 10–50)
POC MID %: 8.2 %M (ref 0–12)
RBC: 4.71 M/uL (ref 4.04–5.48)
RDW, POC: 11.9 %
WBC: 5.9 10*3/uL (ref 4.6–10.2)

## 2015-12-18 LAB — POC MICROSCOPIC URINALYSIS (UMFC): Mucus: ABSENT

## 2015-12-18 LAB — POCT URINALYSIS DIP (MANUAL ENTRY)
BILIRUBIN UA: NEGATIVE
BILIRUBIN UA: NEGATIVE
Glucose, UA: NEGATIVE
NITRITE UA: NEGATIVE
PH UA: 6.5
PROTEIN UA: NEGATIVE
RBC UA: NEGATIVE
Spec Grav, UA: <=1.005
Urobilinogen, UA: 0.2

## 2015-12-18 LAB — COMPREHENSIVE METABOLIC PANEL
ALT: 18 U/L (ref 6–29)
AST: 20 U/L (ref 10–30)
Albumin: 4.4 g/dL (ref 3.6–5.1)
Alkaline Phosphatase: 65 U/L (ref 33–115)
BUN: 12 mg/dL (ref 7–25)
CHLORIDE: 102 mmol/L (ref 98–110)
CO2: 29 mmol/L (ref 20–31)
Calcium: 9.7 mg/dL (ref 8.6–10.2)
Creat: 0.59 mg/dL (ref 0.50–1.10)
Glucose, Bld: 143 mg/dL — ABNORMAL HIGH (ref 65–99)
POTASSIUM: 4.1 mmol/L (ref 3.5–5.3)
Sodium: 140 mmol/L (ref 135–146)
TOTAL PROTEIN: 8 g/dL (ref 6.1–8.1)
Total Bilirubin: 0.8 mg/dL (ref 0.2–1.2)

## 2015-12-18 LAB — GLUCOSE, POCT (MANUAL RESULT ENTRY): POC GLUCOSE: 148 mg/dL — AB (ref 70–99)

## 2015-12-18 LAB — TSH: TSH: 1.34 m[IU]/L

## 2015-12-18 LAB — POCT URINE PREGNANCY: PREG TEST UR: NEGATIVE

## 2015-12-18 NOTE — Progress Notes (Signed)
Patient ID: April Gibson, female   DOB: 06/01/1975, 40 y.o.   MRN: [id number]   Subjective:  By signing my name below, I, Stann Oresung-Kai Tsai, attest that this documentation has been prepared under the direction and in the presence of Nilda SimmerKristi Kasheena Sambrano, MD. Electronically Signed: Stann Oresung-Kai Tsai, Scribe. 12/18/2015 , 11:58 AM .  Patient was seen in Room 12 .   Patient ID: April Gibson, female    DOB: 05/27/1975, 40 y.o.   MRN: [id number]  12/18/2015  Headache (left side of head,  on/off x 2 mos) and Other ("vision changes")   HPI April Gibson is a 40 y.o. female who [facility] complaining of intermittent left sided headache for 2 months with vision changes. I saw the patient back in March with dizziness and lightheadedness with very extensive evaluation at neurology where her MRI was negative. She also had cardiology evaluation with negative carotid doppler, negative echo and event monitor, and chest xray.   She states her headaches about twice a week, and stays persistent for about 10~15 minutes. She takes advil for some temporary relief. She's normally working with regular activity when the headaches occur; works in [employer]. She noticed some vision changes with itchiness where she has to blink a lot to clear her vision. She mentions bright lights don't seem clear to her. She denies any mucus or drainage from her eyes. She also states having ear pain. She noticed tips of her fingers and toes feeling numb, and would continue to last all day; this would resolve on its own the next day. She denies rhinorrhea, sneezing, sore throat, cough, fever, nausea, vomiting, syncope, or dysphoric mood.   She has been needing to eat immediately or she feels as if she will pass out; this sensation has not improved; she has never passed out. She been increasing her protein intake without new changes. For breakfast, she usually eats eggs and a biscuit, and drinks water and milk. For lunch, she would eat some meat including  chicken, and shrimp with water. She would eat some snacks, like cookies or chips, in the afternoon. Her dinner would consists of fish steaks. She's been exercising regularly by walking 3~4 times a week for 20~25 minutes.   Review of Systems  Constitutional: Positive for appetite change. Negative for chills, diaphoresis, fatigue and fever.  HENT: Negative for congestion, ear pain, postnasal drip, rhinorrhea, sinus pressure, sneezing, sore throat, trouble swallowing and voice change.   Eyes: Positive for photophobia, itching and visual disturbance.  Respiratory: Negative for cough and shortness of breath.   Cardiovascular: Negative for chest pain, palpitations and leg swelling.  Gastrointestinal: Negative for abdominal pain, constipation, diarrhea, nausea and vomiting.  Endocrine: Negative for cold intolerance, heat intolerance, polydipsia, polyphagia and polyuria.  Neurological: Positive for numbness and headaches. Negative for dizziness, tremors, seizures, syncope, facial asymmetry, speech difficulty, weakness and light-headedness.  Psychiatric/Behavioral: Negative for dysphoric mood, self-injury, sleep disturbance and suicidal ideas. The patient is not nervous/anxious.     Past Medical History:  Diagnosis Date  . 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 monitor labs yearly   Past Surgical History:  Procedure Laterality Date  . No prior surgery     No Known Allergies Current Outpatient Prescriptions  Medication Sig Dispense Refill  . Ibuprofen (ADVIL) 200 MG CAPS Take 2 capsules by mouth as needed.    . Multiple Vitamin (MULTIVITAMIN) capsule Take 1 capsule by mouth daily.  No current facility-administered medications for this visit.    Social History   Social History  . Marital status: Married    Spouse name: Huntley Dec  . Number of children: 2  . Years of education: 12   Occupational History  . nail technician     Social History Main Topics  . Smoking status: Never Smoker  . Smokeless tobacco: Never Used  . Alcohol use No  . Drug use: No  . Sexual activity: Not on file   Other Topics Concern  . Not on file   Social History Narrative   Lives with parents in a one story home.     Has 2 sons.    Works as a Advertising account planner.     Education: high school.   Family History  Problem Relation Age of Onset  . Hepatitis C Father        Objective:    BP 104/66 (BP Location: Left Arm, Patient Position: Sitting, Cuff Size: Normal)   Pulse (!) 111   Temp 98.5 F (36.9 C)   Resp 16   Ht  (1.499 m)   Wt 121 lb 12.8 oz (55.2 kg)   LMP 11/27/2015   SpO2 98%   BMI 24.60 kg/m   Physical Exam  Constitutional: She is oriented to person, place, and time. She appears well-developed and well-nourished. No distress.  HENT:  Head: Normocephalic and atraumatic.  Right Ear: Tympanic membrane and external ear normal.  Left Ear: Tympanic membrane and external ear normal.  Nose: Nose normal.  Mouth/Throat: Oropharynx is clear and moist.  Eyes: Conjunctivae and EOM are normal. Pupils are equal, round, and reactive to light.  Neck: Normal range of motion. Neck supple. Carotid bruit is not present. No thyromegaly present.  Cardiovascular: Regular rhythm, normal heart sounds and intact distal pulses.  Tachycardia present.  Exam reveals no gallop and no friction rub.   No murmur heard. Pulmonary/Chest: Effort normal and breath sounds normal. No respiratory distress. She has no wheezes. She has no rales.  Abdominal: Soft. Bowel sounds are normal. She exhibits no distension and no mass. There is no tenderness. There is no rebound and no guarding.  Musculoskeletal: Normal range of motion.  Lymphadenopathy:    She has no cervical adenopathy.  Neurological: She is alert and oriented to person, place, and time. No cranial nerve deficit. She exhibits normal muscle tone. Coordination normal.  Neuro exam intact   Skin: Skin is warm and dry. No rash noted. She is not diaphoretic. No erythema. No pallor.  Psychiatric: She has a normal mood and affect. Her behavior is normal. Judgment and thought content normal.  Nursing note and vitals reviewed.       Assessment & Plan:   1. Episodic tension-type headache, not intractable   2. Dizziness and giddiness   3. Chronic viral hepatitis B without delta agent and without coma (HCC)    -New. -recommend rest, fluids, Tylenol or Ibuprofen PRN. -obtain labs to rule out secondary etiology. -likely consistent with viral syndrome and should improve in upcoming week.     Orders Placed This Encounter  Procedures  . Comprehensive metabolic panel  . TSH  . Orthostatic vital signs  . POCT CBC  . POCT glucose (manual entry)  . POCT urinalysis dipstick  . POCT Microscopic Urinalysis ([facility])  . POCT urine pregnancy   No orders of the defined types were placed in this encounter.   No Follow-up on file.    I personally performed the  services described in this documentation, which was scribed in my presence. The recorded information has been reviewed and considered.  Armaan Pond Elayne Guerin, M.D. [facility] [address] [place], [place]  [phone] phone [phone] fax

## 2015-12-18 NOTE — Patient Instructions (Signed)
     IF you received an x-ray today, you will receive an invoice from [facility]. Please contact [facility] at [phone] with questions or concerns regarding your invoice.   IF you received labwork today, you will receive an invoice from [facility]. Please contact [facility] at [phone] with questions or concerns regarding your invoice.   Our billing staff will not be able to assist you with questions regarding bills from these companies.  You will be contacted with the lab results as soon as they are available. The fastest way to get your results is to activate your My Chart account. Instructions are located on the last page of this paperwork. If you have not heard from us regarding the results in 2 weeks, please contact this office.      

## 2016-01-25 ENCOUNTER — Encounter: Payer: Self-pay | Admitting: Family Medicine

## 2016-10-18 ENCOUNTER — Encounter: Payer: Self-pay | Admitting: Physician Assistant

## 2016-10-18 ENCOUNTER — Ambulatory Visit (INDEPENDENT_AMBULATORY_CARE_PROVIDER_SITE_OTHER): Payer: BLUE CROSS/BLUE SHIELD | Admitting: Physician Assistant

## 2016-10-18 VITALS — BP 115/78 | HR 95 | Temp 98.2°F | Resp 17 | Ht 59.5 in | Wt 124.0 lb

## 2016-10-18 DIAGNOSIS — H9202 Otalgia, left ear: Secondary | ICD-10-CM | POA: Diagnosis not present

## 2016-10-18 MED ORDER — FLUTICASONE PROPIONATE 50 MCG/ACT NA SUSP: 2.0000 | Freq: Every day | NASAL | 1 refills | Status: AC

## 2016-10-18 NOTE — Patient Instructions (Addendum)
I would like you to take ibuprofen 600mg  every 6 hours for the head pain.  If you continue to have pain after 5 days, please let us know. I would like you to also use the nasal spray.   Postural Orthostatic Tachycardia Syndrome Postural orthostatic tachycardia syndrome (POTS) is a group of symptoms that can occur when you stand up after lying down. POTS happens when less blood flows to the heart than normal after you stand up. The reduced blood flow to the heart makes the heart beat rapidly. POTS may be associated with another medical condition, or it may occur on its own. What are the causes? This cause of this condition is not known, but many conditions and diseases have been linked to it. What increases the risk? This condition is more likely to develop in:  Women 6315-350 years old.  Women who are pregnant.  Women who are menstruating.  People who have certain conditions, including: ? A viral infection. ? An autoimmune disease. ? Anemia. ? Dehydration. ? Hyperthyroidism.  People who take certain medicines.  People who have had a major injury.  People who have had surgery.  What are the signs or symptoms? The most common symptom of this condition is lightheadedness after standing up from a lying down position. Other symptoms may include:  Feeling a rapid increase in the speed of the heartbeat (tachycardia) within 10 minutes of standing up.  Fainting.  Weakness.  Confusion.  Trembling.  Shortness of breath.  Sweating or flushing.  Headache.  Chest pain.  Breathing that is deeper and faster than normal (hyperventilation).  Nausea.  Anxiety.  Symptoms may be worse in the morning, and they may be relieved by lying down. How is this diagnosed? This condition is diagnosed based on:  Your symptoms.  Your medical history.  A physical exam.  Measurements of your heart rate when you are lying down and after you stand up.  A measurement of your blood  pressure. The measurement will be taken when you go from lying down to standing up.  Blood tests to measure hormones that change with blood pressure. The blood tests will be done when you are lying down and standing up.  You may have other tests to check whether you have a condition or disease that is linked to POTS. How is this treated? Treatment for this condition depends on how severe your symptoms are and whether you have any conditions or diseases that have been linked to POTS. Treatment may involve:  Treating any conditions or diseases that have been linked to POTS.  Drinking two glasses of water before getting up from a lying position.  Eating more salt (sodium).  Taking medicine to control blood pressure and heart rate (beta-blocker).  Taking medicines to control blood flow, blood pressure, or heart rate.  Avoiding certain medicines.  Starting an exercise program under the supervision of your health care provider.  Follow these instructions at home:  Eating and drinking  Drink enough fluid to keep your urine clear or pale yellow.  If told by your health care provider, drink two glasses of water before getting up from a lying position.  Follow instructions from your health care provider about how much sodium you should eat.  Eat several small meals a day instead of a few large meals.  Avoid heavy meals. Medicines  Take over-the-counter and prescription medicines only as told by your health care provider.  Talk with your health care provider before starting any new  medicines. Activity  Do an aerobic exercise for 20 minutes a day, at least 3 days a week.  Ask your health care provider what kinds of exercise are safe for you. Contact a health care provider if:  Your symptoms do not improve after treatment.  Your symptoms get worse.  You develop new symptoms. Get help right away if:  You have chest pain.  You have difficulty breathing.  You have fainting  episodes. This information is not intended to replace advice given to you by your health care provider. Make sure you discuss any questions you have with your health care provider. Document Released: 04/15/2002 Document Revised: 06/03/2015 Document Reviewed: 11/07/2014 Elsevier Interactive Patient Education  2018 [employer].   IF you received an x-ray today, you will receive an invoice from [facility]. Please contact [facility] at [phone] with questions or concerns regarding your invoice.   IF you received labwork today, you will receive an invoice from [facility]. Please contact [facility] at [phone] with questions or concerns regarding your invoice.   Our billing staff will not be able to assist you with questions regarding bills from these companies.  You will be contacted with the lab results as soon as they are available. The fastest way to get your results is to activate your My Chart account. Instructions are located on the last page of this paperwork. If you have not heard from [place] regarding the results in 2 weeks, please contact this office.

## 2016-10-18 NOTE — Progress Notes (Signed)
PRIMARY CARE AT [facility] [address], [place] [address] [phone]  Date:  10/18/2016   Name:  April Gibson   DOB:  11-Sep-1975   MRN:  [id number]  PCP:  Ethelda Chick, MD    History of Present Illness:  April Gibson is a 41 y.o. female patient who presents to PCP with  Chief Complaint  Patient presents with  . Otalgia  . Headache     Ear pain that started yesterday, of the left side, and pain of her head.  No drainage from ear.  No fever.  No change in hearing.   No sore throat or jaw pain.  She has done nothing for hre pain.   Patient Active Problem List   Diagnosis Date Noted  . Palpitations 11/24/2014  . Dizziness and giddiness 09/30/2014  . Orthostatic hypotension 09/30/2014  . Hepatitis B infection without delta agent without hepatic coma 11/22/2012    Past Medical History:  Diagnosis Date  . 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 monitor labs yearly    Past Surgical History:  Procedure Laterality Date  . No prior surgery      Social History  Substance Use Topics  . Smoking status: Never Smoker  . Smokeless tobacco: Never Used  . Alcohol use No    Family History  Problem Relation Age of Onset  . Hepatitis C Father     No Known Allergies  Medication list has been reviewed and updated.  Current Outpatient Prescriptions on File Prior to Visit  Medication Sig Dispense Refill  . Ibuprofen (ADVIL) 200 MG CAPS Take 2 capsules by mouth as needed.    . Multiple Vitamin (MULTIVITAMIN) capsule Take 1 capsule by mouth daily.     No current facility-administered medications on file prior to visit.     ROS ROS otherwise unremarkable unless listed above.  Physical Examination: BP 115/78   Pulse 95   Temp 98.2 F (36.8 C) (Oral)   Resp 17   Ht 4' 11.5" (1.511 m)   Wt 124 lb (56.2 kg)   LMP 10/03/2016 (Approximate)   SpO2 98%   BMI 24.63 kg/m  Ideal Body Weight: Weight in (lb)  to have BMI = 25: 125.6  Physical Exam  Constitutional: She is oriented to person, place, and time. She appears well-developed and well-nourished. No distress.  HENT:  Head: Normocephalic and atraumatic.  Right Ear: Tympanic membrane, external ear and ear canal normal.  Left Ear: Tympanic membrane, external ear and ear canal normal.  Nose: No mucosal edema or rhinorrhea. Right sinus exhibits no maxillary sinus tenderness and no frontal sinus tenderness. Left sinus exhibits no maxillary sinus tenderness and no frontal sinus tenderness.  Mouth/Throat: No oropharyngeal exudate, posterior oropharyngeal edema or posterior oropharyngeal erythema. No tonsillar exudate.  Eyes: Conjunctivae and EOM are normal. Pupils are equal, round, and reactive to light.  Cardiovascular: Normal rate and regular rhythm.   Pulses:      Carotid pulses are 2+ on the right side, and 2+ on the left side. Pulmonary/Chest: Effort normal. No respiratory distress. She has no wheezes.  Neurological: She is alert and oriented to person, place, and time.  Skin: Skin is warm and dry. She is not diaphoretic.  Psychiatric: She has a normal mood and affect. Her behavior is normal.     Assessment and Plan: April Gibson is a 41 y.o. female who is here today for cc of  left ear pain. Possibly allergies untreated.  Possibly too soon to tell.  Advised ibuprofen at this time for pain She will return if sxs do improve in 1 week.  Sooner if sxs worsen.   Left ear pain - Plan: fluticasone (FLONASE) 50 MCG/ACT nasal spray  Trena PlattStephanie English, PA-C [facility] 6/15/20185:40 PM

## 2017-08-08 ENCOUNTER — Encounter: Payer: Self-pay | Admitting: Physician Assistant

## 2017-09-28 ENCOUNTER — Encounter: Payer: Self-pay | Admitting: Family Medicine

## 2022-05-05 DIAGNOSIS — Z01419 Encounter for gynecological examination (general) (routine) without abnormal findings: Secondary | ICD-10-CM | POA: Diagnosis not present

## 2022-05-05 DIAGNOSIS — Z1389 Encounter for screening for other disorder: Secondary | ICD-10-CM | POA: Diagnosis not present

## 2022-05-05 DIAGNOSIS — Z130 Encounter for screening for diseases of the blood and blood-forming organs and certain disorders involving the immune mechanism: Secondary | ICD-10-CM | POA: Diagnosis not present

## 2023-05-09 DIAGNOSIS — Z130 Encounter for screening for diseases of the blood and blood-forming organs and certain disorders involving the immune mechanism: Secondary | ICD-10-CM | POA: Diagnosis not present

## 2023-05-09 DIAGNOSIS — Z1389 Encounter for screening for other disorder: Secondary | ICD-10-CM | POA: Diagnosis not present

## 2023-05-09 DIAGNOSIS — Z01419 Encounter for gynecological examination (general) (routine) without abnormal findings: Secondary | ICD-10-CM | POA: Diagnosis not present

## 2023-05-09 DIAGNOSIS — Z1211 Encounter for screening for malignant neoplasm of colon: Secondary | ICD-10-CM | POA: Diagnosis not present

## 2023-05-09 DIAGNOSIS — Z1231 Encounter for screening mammogram for malignant neoplasm of breast: Secondary | ICD-10-CM | POA: Diagnosis not present

## 2023-05-16 DIAGNOSIS — Z1211 Encounter for screening for malignant neoplasm of colon: Secondary | ICD-10-CM | POA: Diagnosis not present

## 2023-11-16 DIAGNOSIS — Z131 Encounter for screening for diabetes mellitus: Secondary | ICD-10-CM | POA: Diagnosis not present

## 2023-11-16 DIAGNOSIS — Z7689 Persons encountering health services in other specified circumstances: Secondary | ICD-10-CM | POA: Diagnosis not present

## 2023-11-16 DIAGNOSIS — Z13220 Encounter for screening for lipoid disorders: Secondary | ICD-10-CM | POA: Diagnosis not present

## 2023-11-16 DIAGNOSIS — Z0000 Encounter for general adult medical examination without abnormal findings: Secondary | ICD-10-CM | POA: Diagnosis not present

## 2023-11-16 DIAGNOSIS — Z1329 Encounter for screening for other suspected endocrine disorder: Secondary | ICD-10-CM | POA: Diagnosis not present
